# Patient Record
Sex: Male | Born: 1955 | Race: White | Hispanic: No | Smoking: Former smoker
Health system: Southern US, Community
[De-identification: ages and names within clinical notes are randomized; demographics above are authoritative.]

## PROBLEM LIST (undated history)

## (undated) DIAGNOSIS — M48061 Spinal stenosis, lumbar region without neurogenic claudication: Secondary | ICD-10-CM

## (undated) DIAGNOSIS — G629 Polyneuropathy, unspecified: Secondary | ICD-10-CM

## (undated) DIAGNOSIS — M5126 Other intervertebral disc displacement, lumbar region: Secondary | ICD-10-CM

## (undated) DIAGNOSIS — C61 Malignant neoplasm of prostate: Secondary | ICD-10-CM

## (undated) DIAGNOSIS — J189 Pneumonia, unspecified organism: Secondary | ICD-10-CM

## (undated) DIAGNOSIS — K219 Gastro-esophageal reflux disease without esophagitis: Secondary | ICD-10-CM

## (undated) DIAGNOSIS — M51369 Other intervertebral disc degeneration, lumbar region without mention of lumbar back pain or lower extremity pain: Secondary | ICD-10-CM

## (undated) DIAGNOSIS — J449 Chronic obstructive pulmonary disease, unspecified: Secondary | ICD-10-CM

## (undated) DIAGNOSIS — C801 Malignant (primary) neoplasm, unspecified: Secondary | ICD-10-CM

## (undated) DIAGNOSIS — M5136 Other intervertebral disc degeneration, lumbar region: Secondary | ICD-10-CM

## (undated) HISTORY — PX: PROSTATE BIOPSY: SHX241

## (undated) HISTORY — DX: Malignant neoplasm of prostate: C61

---

## 2004-03-22 ENCOUNTER — Ambulatory Visit (HOSPITAL_COMMUNITY): Admission: RE | Admit: 2004-03-22 | Discharge: 2004-03-22 | Payer: Self-pay | Admitting: Chiropractic Medicine

## 2004-09-24 ENCOUNTER — Emergency Department (HOSPITAL_COMMUNITY): Admission: EM | Admit: 2004-09-24 | Discharge: 2004-09-24 | Payer: Self-pay | Admitting: Emergency Medicine

## 2008-03-11 ENCOUNTER — Ambulatory Visit (HOSPITAL_COMMUNITY): Admission: RE | Admit: 2008-03-11 | Discharge: 2008-03-11 | Payer: Self-pay | Admitting: *Deleted

## 2014-01-09 ENCOUNTER — Other Ambulatory Visit (HOSPITAL_COMMUNITY): Payer: Self-pay | Admitting: Urology

## 2014-01-09 DIAGNOSIS — N400 Enlarged prostate without lower urinary tract symptoms: Secondary | ICD-10-CM | POA: Diagnosis not present

## 2014-01-14 ENCOUNTER — Ambulatory Visit (HOSPITAL_COMMUNITY)
Admission: RE | Admit: 2014-01-14 | Discharge: 2014-01-14 | Disposition: A | Discharge status: Home / Self Care | Payer: Medicare Other | Source: Ambulatory Visit | Attending: Urology | Admitting: Urology

## 2014-01-14 ENCOUNTER — Encounter (HOSPITAL_COMMUNITY): Payer: Self-pay

## 2014-01-14 DIAGNOSIS — N400 Enlarged prostate without lower urinary tract symptoms: Secondary | ICD-10-CM | POA: Diagnosis not present

## 2014-01-14 DIAGNOSIS — M549 Dorsalgia, unspecified: Secondary | ICD-10-CM | POA: Diagnosis not present

## 2014-01-14 DIAGNOSIS — R109 Unspecified abdominal pain: Secondary | ICD-10-CM | POA: Diagnosis not present

## 2014-01-14 LAB — POCT I-STAT CREATININE: CREATININE: 1.1 mg/dL (ref 0.50–1.35)

## 2014-01-14 MED ORDER — IOHEXOL 300 MG/ML  SOLN
125.0000 mL | Freq: Once | INTRAMUSCULAR | Status: AC | PRN
  Administered 2014-01-14: 125 mL via INTRAVENOUS

## 2014-01-27 DIAGNOSIS — N400 Enlarged prostate without lower urinary tract symptoms: Secondary | ICD-10-CM | POA: Diagnosis not present

## 2014-01-27 DIAGNOSIS — R972 Elevated prostate specific antigen [PSA]: Secondary | ICD-10-CM | POA: Diagnosis not present

## 2014-03-03 DIAGNOSIS — N400 Enlarged prostate without lower urinary tract symptoms: Secondary | ICD-10-CM | POA: Diagnosis not present

## 2014-03-31 DIAGNOSIS — R972 Elevated prostate specific antigen [PSA]: Secondary | ICD-10-CM | POA: Diagnosis not present

## 2014-04-29 DIAGNOSIS — R972 Elevated prostate specific antigen [PSA]: Secondary | ICD-10-CM | POA: Diagnosis not present

## 2014-05-06 DIAGNOSIS — R972 Elevated prostate specific antigen [PSA]: Secondary | ICD-10-CM | POA: Diagnosis not present

## 2014-05-06 DIAGNOSIS — N400 Enlarged prostate without lower urinary tract symptoms: Secondary | ICD-10-CM | POA: Diagnosis not present

## 2014-06-30 DIAGNOSIS — F17210 Nicotine dependence, cigarettes, uncomplicated: Secondary | ICD-10-CM | POA: Diagnosis not present

## 2014-06-30 DIAGNOSIS — R972 Elevated prostate specific antigen [PSA]: Secondary | ICD-10-CM | POA: Diagnosis not present

## 2014-06-30 DIAGNOSIS — R3919 Other difficulties with micturition: Secondary | ICD-10-CM | POA: Diagnosis not present

## 2014-06-30 DIAGNOSIS — N4289 Other specified disorders of prostate: Secondary | ICD-10-CM | POA: Diagnosis not present

## 2014-07-31 DIAGNOSIS — R3919 Other difficulties with micturition: Secondary | ICD-10-CM | POA: Diagnosis not present

## 2014-07-31 DIAGNOSIS — F17210 Nicotine dependence, cigarettes, uncomplicated: Secondary | ICD-10-CM | POA: Diagnosis not present

## 2014-07-31 DIAGNOSIS — C61 Malignant neoplasm of prostate: Secondary | ICD-10-CM | POA: Diagnosis not present

## 2014-07-31 DIAGNOSIS — N4289 Other specified disorders of prostate: Secondary | ICD-10-CM | POA: Diagnosis not present

## 2014-07-31 DIAGNOSIS — D075 Carcinoma in situ of prostate: Secondary | ICD-10-CM | POA: Diagnosis not present

## 2014-07-31 DIAGNOSIS — R972 Elevated prostate specific antigen [PSA]: Secondary | ICD-10-CM | POA: Diagnosis not present

## 2014-08-08 DIAGNOSIS — R3919 Other difficulties with micturition: Secondary | ICD-10-CM | POA: Diagnosis not present

## 2014-08-08 DIAGNOSIS — C61 Malignant neoplasm of prostate: Secondary | ICD-10-CM | POA: Diagnosis not present

## 2014-08-08 DIAGNOSIS — R972 Elevated prostate specific antigen [PSA]: Secondary | ICD-10-CM | POA: Diagnosis not present

## 2014-08-08 DIAGNOSIS — F17210 Nicotine dependence, cigarettes, uncomplicated: Secondary | ICD-10-CM | POA: Diagnosis not present

## 2014-08-29 DIAGNOSIS — F17210 Nicotine dependence, cigarettes, uncomplicated: Secondary | ICD-10-CM | POA: Diagnosis not present

## 2014-08-29 DIAGNOSIS — C61 Malignant neoplasm of prostate: Secondary | ICD-10-CM | POA: Diagnosis not present

## 2014-08-29 DIAGNOSIS — R972 Elevated prostate specific antigen [PSA]: Secondary | ICD-10-CM | POA: Diagnosis not present

## 2014-10-08 ENCOUNTER — Ambulatory Visit: Payer: Medicare Other | Admitting: Radiation Oncology

## 2014-12-02 DIAGNOSIS — C61 Malignant neoplasm of prostate: Secondary | ICD-10-CM | POA: Diagnosis not present

## 2014-12-03 ENCOUNTER — Other Ambulatory Visit (HOSPITAL_COMMUNITY): Payer: Self-pay | Admitting: Urology

## 2014-12-03 ENCOUNTER — Other Ambulatory Visit: Payer: Self-pay | Admitting: Urology

## 2014-12-03 DIAGNOSIS — C61 Malignant neoplasm of prostate: Secondary | ICD-10-CM

## 2014-12-10 DIAGNOSIS — M6281 Muscle weakness (generalized): Secondary | ICD-10-CM | POA: Diagnosis not present

## 2014-12-10 DIAGNOSIS — C61 Malignant neoplasm of prostate: Secondary | ICD-10-CM | POA: Diagnosis not present

## 2014-12-17 ENCOUNTER — Encounter (HOSPITAL_COMMUNITY)
Admission: RE | Admit: 2014-12-17 | Discharge: 2014-12-17 | Disposition: A | Discharge status: Home / Self Care | Payer: Medicare Other | Source: Ambulatory Visit | Attending: Urology | Admitting: Urology

## 2014-12-17 DIAGNOSIS — I723 Aneurysm of iliac artery: Secondary | ICD-10-CM | POA: Diagnosis not present

## 2014-12-17 DIAGNOSIS — C61 Malignant neoplasm of prostate: Secondary | ICD-10-CM | POA: Diagnosis not present

## 2014-12-17 MED ORDER — TECHNETIUM TC 99M MEDRONATE IV KIT
25.0000 | PACK | Freq: Once | INTRAVENOUS | Status: AC | PRN
  Administered 2014-12-17: 25 via INTRAVENOUS

## 2014-12-22 NOTE — Patient Instructions (Addendum)
Your procedure is scheduled on:  12/29/14  MONDAY  Report to [facility] at   0900    AM.   Call this number if you have problems the morning of surgery: [phone]     BOWEL PREP AS PER OFFICE   Do not TAKE ANYTHING BY MOUTH After Midnight. Sunday NIGHT   Take these medicines the morning of surgery with A SIP OF WATER:NONE   .  Contacts, dentures or partial plates, or metal hairpins  can not be worn to surgery. Your family will be responsible for glasses, dentures, hearing aides while you are in surgery  Leave suitcase in the car. After surgery it may be brought to your room.  For patients admitted to the hospital, checkout time is 11:00 AM day of  discharge.         [facility] IS NOT RESPONSIBLE FOR ANY VALUABLES  Patients discharged the day of surgery will not be allowed to drive home. IF going home the day of surgery, you must have a driver and someone to stay with you for the first 24 hours                                                                    Incentive Spirometer  An incentive spirometer is a tool that can help keep your lungs clear and active. This tool measures how well you are filling your lungs with each breath. Taking long deep breaths may help reverse or decrease the chance of developing breathing (pulmonary) problems (especially infection) following:  A long period of time when you are unable to move or be active. BEFORE THE PROCEDURE   If the spirometer includes an indicator to show your best effort, your nurse or respiratory therapist will set it to a desired goal.  If possible, sit up straight or lean slightly forward. Try not to slouch.  Hold the incentive spirometer in an upright position. INSTRUCTIONS FOR USE   Sit on the edge of your bed if possible, or sit up as far as you can in bed or on a chair.  Hold the incentive spirometer in an upright position.  Breathe  out normally.  Place the mouthpiece in your mouth and seal your lips tightly around it.  Breathe in slowly and as deeply as possible, raising the piston or the ball toward the top of the column.  Hold your breath for 3-5 seconds or for as long as possible. Allow the piston or ball to fall to the bottom of the column.  Remove the mouthpiece from your mouth and breathe out normally.  Rest for a few seconds and repeat Steps 1 through 7 at least 10 times every 1-2 hours when you are awake. Take your time and take a few normal breaths between deep breaths.  The spirometer may include an indicator to show your best effort. Use the indicator as a goal to work toward during each repetition.  After each set of 10 deep breaths, practice coughing to be sure your lungs are clear. If you have an incision (the cut made at the time of surgery), support your incision when coughing by placing a pillow  or rolled up towels firmly against it. Once you are able to get out of bed, walk around indoors and cough well. You may stop using the incentive spirometer when instructed by your caregiver.  RISKS AND COMPLICATIONS  Take your time so you do not get dizzy or light-headed.  If you are in pain, you may need to take or ask for pain medication before doing incentive spirometry. It is harder to take a deep breath if you are having pain. AFTER USE  Rest and breathe slowly and easily.  It can be helpful to keep track of a log of your progress. Your caregiver can provide you with a simple table to help with this. If you are using the spirometer at home, follow these instructions: [place] IF:   You are having difficultly using the spirometer.  You have trouble using the spirometer as often as instructed.  Your pain medication is not giving enough relief while using the spirometer.  You develop fever of 100.5 F (38.1 C) or higher. SEEK IMMEDIATE MEDICAL CARE IF:   You cough up bloody sputum that  had not been present before.  You develop fever of 102 F (38.9 C) or greater.  You develop worsening pain at or near the incision site. MAKE SURE YOU:   Understand these instructions.  Will watch your condition.  Will get help right away if you are not doing well or get worse. Document Released: 01/23/2007 Document Revised: 12/05/2011 Document Reviewed: 03/26/2007 ExitCare Patient Information 2014 ExitCare, [place].   ________________________________________________________________________  WHAT IS A BLOOD TRANSFUSION? Blood Transfusion Information  A transfusion is the replacement of blood or some of its parts. Blood is made up of multiple cells which provide different functions.  Red blood cells carry oxygen and are used for blood loss replacement.  White blood cells fight against infection.  Platelets control bleeding.  Plasma helps clot blood.  Other blood products are available for specialized needs, such as hemophilia or other clotting disorders. BEFORE THE TRANSFUSION  Who gives blood for transfusions?   Healthy volunteers who are fully evaluated to make sure their blood is safe. This is blood bank blood. Transfusion therapy is the safest it has ever been in the practice of medicine. Before blood is taken from a donor, a complete history is taken to make sure that person has no history of diseases nor engages in risky social behavior (examples are intravenous drug use or sexual activity with multiple partners). The donor's travel history is screened to minimize risk of transmitting infections, such as malaria. The donated blood is tested for signs of infectious diseases, such as HIV and hepatitis. The blood is then tested to be sure it is compatible with you in order to minimize the chance of a transfusion reaction. If you or a relative donates blood, this is often done in anticipation of surgery and is not appropriate for emergency situations. It takes many days to process  the donated blood. RISKS AND COMPLICATIONS Although transfusion therapy is very safe and saves many lives, the main dangers of transfusion include:   Getting an infectious disease.  Developing a transfusion reaction. This is an allergic reaction to something in the blood you were given. Every precaution is taken to prevent this. The decision to have a blood transfusion has been considered carefully by your caregiver before blood is given. Blood is not given unless the benefits outweigh the risks. AFTER THE TRANSFUSION  Right after receiving a blood transfusion, you will  usually feel much better and more energetic. This is especially true if your red blood cells have gotten low (anemic). The transfusion raises the level of the red blood cells which carry oxygen, and this usually causes an energy increase.  The nurse administering the transfusion will monitor you carefully for complications. HOME CARE INSTRUCTIONS  No special instructions are needed after a transfusion. You may find your energy is better. Speak with your caregiver about any limitations on activity for underlying diseases you may have. SEEK MEDICAL CARE IF:   Your condition is not improving after your transfusion.  You develop redness or irritation at the intravenous (IV) site. SEEK IMMEDIATE MEDICAL CARE IF:  Any of the following symptoms occur over the next 12 hours:  Shaking chills.  You have a temperature by mouth above 102 F (38.9 C), not controlled by medicine.  Chest, back, or muscle pain.  People around you feel you are not acting correctly or are confused.  Shortness of breath or difficulty breathing.  Dizziness and fainting.  You get a rash or develop hives.  You have a decrease in urine output.  Your urine turns a dark color or changes to pink, red, or brown. Any of the following symptoms occur over the next 10 days:  You have a temperature by mouth above 102 F (38.9 C), not controlled by  medicine.  Shortness of breath.  Weakness after normal activity.  The white part of the eye turns yellow (jaundice).  You have a decrease in the amount of urine or are urinating less often.  Your urine turns a dark color or changes to pink, red, or brown. Document Released: 09/09/2000 Document Revised: 12/05/2011 Document Reviewed: 04/28/2008 ExitCare Patient Information 2014 ExitCare, [place].  _______________________________________________________________________   CLEAR LIQUID DIET   Foods Allowed                                                                     Foods Excluded  Coffee and tea, regular and decaf                             liquids that you cannot  Plain Jell-O in any flavor                                             see through such as: Fruit ices (not with fruit pulp)                                     milk, soups, orange juice  Iced Popsicles                                    All solid food Carbonated beverages, regular and diet                                    Cranberry,  grape and apple juices Sports drinks like Gatorade Lightly seasoned clear broth or consume(fat free) Sugar, honey syrup  Sample Menu Breakfast                                Lunch                                     Supper Cranberry juice                    Beef broth                            Chicken broth Jell-O                                     Grape juice                           Apple juice Coffee or tea                        Jell-O                                      Popsicle                                                Coffee or tea                        Coffee or tea  _____________________________________________________________________  [facility] - Preparing for Surgery Before surgery, you can play an important role.  Because skin is not sterile, your skin needs to be as free of germs as possible.  You can reduce the number of germs on your skin by washing with  CHG (chlorahexidine gluconate) soap before surgery.  CHG is an antiseptic cleaner which kills germs and bonds with the skin to continue killing germs even after washing. Please DO NOT use if you have an allergy to CHG or antibacterial soaps.  If your skin becomes reddened/irritated stop using the CHG and inform your nurse when you arrive at Short Stay. Do not shave (including legs and underarms) for at least 48 hours prior to the first CHG shower.  You may shave your face/neck. Please follow these instructions carefully:  1.  Shower with CHG Soap the night before surgery and the  morning of Surgery.  2.  If you choose to wash your hair, wash your hair first as usual with your  normal  shampoo.  3.  After you shampoo, rinse your hair and body thoroughly to remove the  shampoo.                           4.  Use CHG as you would any other liquid soap.  You can apply chg directly  to the skin and wash  Gently with a scrungie or clean washcloth.  5.  Apply the CHG Soap to your body ONLY FROM THE NECK DOWN.   Do not use on face/ open                           Wound or open sores. Avoid contact with eyes, ears mouth and genitals (private parts).                       Wash face,  Genitals (private parts) with your normal soap.             6.  Wash thoroughly, paying special attention to the area where your surgery  will be performed.  7.  Thoroughly rinse your body with warm water from the neck down.  8.  DO NOT shower/wash with your normal soap after using and rinsing off  the CHG Soap.                9.  Pat yourself dry with a clean towel.            10.  Wear clean pajamas.            11.  Place clean sheets on your bed the night of your first shower and do not  sleep with pets. Day of Surgery : Do not apply any lotions/deodorants the morning of surgery.  Please wear clean clothes to the hospital/surgery center.  FAILURE TO FOLLOW THESE INSTRUCTIONS MAY RESULT IN THE CANCELLATION  OF YOUR SURGERY PATIENT SIGNATURE_________________________________  NURSE SIGNATURE__________________________________  ________________________________________________________________________

## 2014-12-23 ENCOUNTER — Encounter (HOSPITAL_COMMUNITY)
Admission: RE | Admit: 2014-12-23 | Discharge: 2014-12-23 | Disposition: A | Discharge status: Home / Self Care | Payer: Medicare Other | Source: Ambulatory Visit | Attending: Urology | Admitting: Urology

## 2014-12-23 ENCOUNTER — Other Ambulatory Visit (HOSPITAL_COMMUNITY): Payer: Self-pay | Admitting: Urology

## 2014-12-23 ENCOUNTER — Encounter (HOSPITAL_COMMUNITY): Payer: Self-pay

## 2014-12-23 ENCOUNTER — Ambulatory Visit (HOSPITAL_COMMUNITY)
Admission: RE | Admit: 2014-12-23 | Discharge: 2014-12-23 | Disposition: A | Discharge status: Home / Self Care | Payer: Medicare Other | Source: Ambulatory Visit | Attending: Urology | Admitting: Urology

## 2014-12-23 DIAGNOSIS — C61 Malignant neoplasm of prostate: Secondary | ICD-10-CM

## 2014-12-23 DIAGNOSIS — M542 Cervicalgia: Secondary | ICD-10-CM | POA: Insufficient documentation

## 2014-12-23 DIAGNOSIS — J449 Chronic obstructive pulmonary disease, unspecified: Secondary | ICD-10-CM | POA: Diagnosis not present

## 2014-12-23 DIAGNOSIS — M5032 Other cervical disc degeneration, mid-cervical region: Secondary | ICD-10-CM | POA: Diagnosis not present

## 2014-12-23 DIAGNOSIS — M6281 Muscle weakness (generalized): Secondary | ICD-10-CM | POA: Diagnosis not present

## 2014-12-23 HISTORY — DX: Pneumonia, unspecified organism: J18.9

## 2014-12-23 HISTORY — DX: Spinal stenosis, lumbar region without neurogenic claudication: M48.061

## 2014-12-23 HISTORY — DX: Other intervertebral disc displacement, lumbar region: M51.26

## 2014-12-23 HISTORY — DX: Gastro-esophageal reflux disease without esophagitis: K21.9

## 2014-12-23 HISTORY — DX: Polyneuropathy, unspecified: G62.9

## 2014-12-23 HISTORY — DX: Malignant (primary) neoplasm, unspecified: C80.1

## 2014-12-23 HISTORY — DX: Other intervertebral disc degeneration, lumbar region: M51.36

## 2014-12-23 HISTORY — DX: Chronic obstructive pulmonary disease, unspecified: J44.9

## 2014-12-23 HISTORY — DX: Other intervertebral disc degeneration, lumbar region without mention of lumbar back pain or lower extremity pain: M51.369

## 2014-12-23 LAB — CBC
HEMATOCRIT: 45.1 % (ref 39.0–52.0)
HEMOGLOBIN: 14.5 g/dL (ref 13.0–17.0)
MCH: 27.6 pg (ref 26.0–34.0)
MCHC: 32.2 g/dL (ref 30.0–36.0)
MCV: 85.7 fL (ref 78.0–100.0)
Platelets: 210 10*3/uL (ref 150–400)
RBC: 5.26 MIL/uL (ref 4.22–5.81)
RDW: 14.7 % (ref 11.5–15.5)
WBC: 11.8 10*3/uL — AB (ref 4.0–10.5)

## 2014-12-23 LAB — BASIC METABOLIC PANEL
Anion gap: 7 (ref 5–15)
BUN: 14 mg/dL (ref 6–23)
CALCIUM: 9.3 mg/dL (ref 8.4–10.5)
CO2: 24 mmol/L (ref 19–32)
Chloride: 104 mmol/L (ref 96–112)
Creatinine, Ser: 1.01 mg/dL (ref 0.50–1.35)
GFR calc Af Amer: >90 mL/min (ref >90)
GFR calc non Af Amer: 79 mL/min — ABNORMAL LOW (ref >90)
Glucose, Bld: 104 mg/dL — ABNORMAL HIGH (ref 70–99)
Potassium: 4.4 mmol/L (ref 3.5–5.1)
Sodium: 135 mmol/L (ref 135–145)

## 2014-12-23 NOTE — Progress Notes (Signed)
Faxed cbc to dr borden via epic

## 2014-12-26 NOTE — H&P (Signed)
Chief Complaint Prostate Cancer   Reason For Visit Reason for consult: To discuss treatment options for prostate cancer.  Physician requesting consult: Dr. Clyde Lundborg  PCP: None   History of Present Illness Carl Bowen is a 59 year old gentleman who was noted to have an elevated PSA of 12.5 and was found to be 13.76 when repeated prompting a prostate biopsy on 07/31/14. 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 He has been counseled by Dr. Exie Parody thoroughly about his treatment options. He has no family history of prostate cancer. He does have significant back pain and is disabled due to his back pain. He has noted some increased pain in his lower back and radiating to his groins more recently. He typically takes Tylenol for relief.    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    TNM stage: cT2c Nx Mx  PSA: 13.76  Gleason score: 4+3=7  Biopsy (07/31/14 - read by Dr. [place], Acc # [id number]): 15/15 cores positive - -57% of total tissue   Left: L apex (2/2 cores, 40 and 60%, 4+3=7), L mid (2/2 cores, 40 and 90%, 4+3=7), L base (3/3 cores, 50,70 and 80%, 4+3=7)   Right: R apex (2/2 cores, 50 and 50%, 3+4=7), R mid (3/3 cores, 25, 75, and 76%, 3+4=7), R base (3/3 cores, 25, 50, and 50%, 3+4=7)  Prostate volume: 27 cc    Urinary function: He does have moderate voiding symptoms. IPSS is 20. He does take tamsulosin 0.4 mg.  Erectile function: SHIM score is 1. He is sexually inactive and has been for multiple years. This is not a priority to him.   Past Medical History Problems  1. History of back problems 2. History of spinal stenosis (Z87.39)  Surgical History Problems  1. History of No Surgical Problems  Current Meds 1. Tamsulosin HCl - 0.4 MG Oral Capsule;  Therapy: (Recorded:08Mar2016) to Recorded 2. Tylenol  CAPS;  Therapy: (Recorded:08Mar2016) to Recorded  Allergies Medication  1. No Known Drug Allergies  Family History Problems  1. Family history of cardiac disorder (Z82.49) : Daughter 2. Family history of diabetes mellitus (Z83.3) : Mother, Father 3. Family history of Heart transplanted : Daughter 4. Family history of Urinary calculus : Brother  Social History Problems    Denied: History of Alcohol use   Current every day smoker (F17.200)   Divorced  Review of Systems Genitourinary, constitutional, skin, eye, otolaryngeal, hematologic/lymphatic, cardiovascular, pulmonary, endocrine, musculoskeletal, gastrointestinal, neurological and psychiatric system(s) were reviewed and pertinent findings if present are noted and are otherwise negative.  Musculoskeletal: back pain and joint pain.    Vitals Vital Signs [Data Includes: Last 1 Day]  Recorded: [id number] 11:09AM  Height: 5 ft 11 in Weight: 193 lb  BMI Calculated: 26.92 BSA Calculated: 2.08 Blood Pressure: 122 / 73 Heart Rate: 76  Physical Exam Constitutional: Well nourished and well developed . No acute distress.  ENT:. The ears and nose are normal in appearance.  Neck: The appearance of the neck is normal and no neck mass is present . There is no jugular-venous distention.  Pulmonary: No respiratory distress and normal respiratory rhythm and effort.  Cardiovascular: Heart rate and rhythm are normal . No peripheral edema.  Abdomen: The abdomen is soft and nontender. No masses are palpated. No CVA tenderness. No hernias are palpable. No hepatosplenomegaly noted.  Rectal: Prostate size is estimated to be 45 g. His prostate is diffusely firm with multiple nodular areas bilaterally. There is no definite extraprostatic disease.  Lymphatics: The femoral and inguinal nodes are not enlarged or tender.  Skin: Normal skin turgor, no visible rash and no visible skin lesions.  Neuro/Psych:. Mood and affect are appropriate.     Results/Data Urine [Data Includes: Last 1 Day]   [id number]  COLOR YELLOW   APPEARANCE CLEAR   SPECIFIC GRAVITY 1.020   pH 7.0   GLUCOSE NEG mg/dL  BILIRUBIN NEG   KETONE NEG mg/dL  BLOOD NEG   PROTEIN NEG mg/dL  UROBILINOGEN 0.2 mg/dL  NITRITE NEG   LEUKOCYTE ESTERASE NEG    I have reviewed his medical records, PSA results, and pathology report. Findings are as dictated above.   Assessment Assessed  1. Prostate cancer (C61)  Plan Health Maintenance  1. UA With REFLEX; [Do Not Release]; Status:Complete;   Done: [id number] 10:47AM Prostate cancer  2. BONE SCAN; Status:Hold For - Appointment,PreCert,Print,Records; Requested  for:08Mar2016;  3. CREATININE with eGFR; Status:In Progress - Specimen/Data Collected;   Done:  [id number] 4. CT-PELVIS WITH CONTRAST; Status:Hold For - Appointment,PreCert,Date of  Service,Print; Requested for:08Mar2016;  5. VENIPUNCTURE; Status:Complete;   Done: [id number] 6. Follow-up Office  Follow-up - will call to schedule surgery.  Status: Complete  Done:  [id number]  Discussion/Summary 1. Prostate cancer: We discussed his prostate cancer diagnosis in detail. I did recommend that he undergo a complete staging evaluation including bone scan and CT scan imaging for further evaluation considering his bilaterally palpable disease and high-volume disease noted on his biopsy as well as his back pain symptoms. Assuming that this is negative, we discussed options for therapy for curative intent including the primary surgical therapy and primary radiation therapy. He understands that surgical therapy would carry a high risk of potentially necessitating adjuvant or salvage treatment in the future considering his disease parameters. He also understands that optimal therapy with radiation would involve androgen deprivation treatment. We have reviewed the pros and cons of these options extensively today.   The patient was counseled about the natural history of  prostate cancer and the standard treatment options that are available for prostate cancer. It was explained to him how his age and life expectancy, clinical stage, Gleason score, and PSA affect his prognosis, the decision to proceed with additional staging studies, as well as how that information influences recommended treatment strategies. We discussed the roles for active surveillance, radiation therapy, surgical therapy, androgen deprivation, as well as ablative therapy options for the treatment of prostate cancer as appropriate to his individual cancer situation. We discussed the risks and benefits of these options with regard to their impact on cancer control and also in terms of potential adverse events, complications, and impact on quiality of life particularly related to urinary, bowel, and sexual function. The patient was encouraged to ask questions throughout the discussion today and all questions were answered to his stated satisfaction. In addition, the patient was provided with and/or directed to appropriate resources and literature for further education about prostate cancer and treatment options.   We discussed surgical therapy for prostate cancer including the different available surgical approaches. We discussed, in detail, the risks and expectations of surgery with regard to cancer control, urinary control, and erectile function as well as the expected postoperative recovery process. The risks, potential complications/adverse events of radical prostatectomy as well as alternative options were explained to the patient.     After  further discussion, he is most interested in primary surgical therapy assuming that his staging studies are negative for metastatic disease. Considering his erectile dysfunction and his high-risk disease, I recommended that we consider a non-nerve sparing robotic-assisted laparoscopic radical prostatectomy and bilateral pelvic lymphadenectomy. This will tentatively be  scheduled pending his staging studies.    Cc: Dr. Clyde Lundborg    Verified Results CT-PELVIS WITH CONTRAST1 [id number] 12:00AM1 Read Drivers  [Dec 17, 2014 9:13PM Alinda Money, Yulia Ulrich] Please let Mr. Snipe know that overall his bone scan and CT scan look ok. The only finding that raises any concern is the cervical spine. The changes on his bone scan are likely related to degenerative (arthritic) changes but I would recommend he get plain x-rays of his cervical spine for further evaluation to be sure. I have placed order. Please notify patient. Please order ASAP (or communicate to [facility]) as patient is scheduled for surgery soon. Thanks.   Test Name Result Flag Reference  CT-PELVIS WITH CONTRAST1 (Report)1    ** RADIOLOGY REPORT BY [facility] **   CLINICAL DATA: 59 year old male with high risk clinically localized prostate cancer presenting with back pain. Evaluate for potential metastatic disease.  EXAM: CT PELVIS WITH CONTRAST  TECHNIQUE: Multidetector CT imaging of the pelvis was performed using the standard protocol following the bolus administration of intravenous contrast.  CONTRAST: 100 mL of Isovue-300.  COMPARISON: CT of the abdomen and pelvis 01/14/2014.  FINDINGS: Urinary Tract: Visualized portions of the lower kidneys, ureters and urinary bladder are normal in appearance.  Bowel: No pathologic dilatation of the visualized portions of the small bowel or colon. Normal appendix.  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 No definite pathologically enlarged lymph nodes are noted in the pelvis or visualized portions of the lower retroperitoneum. There are several prominent but nonenlarged right-sided pelvic lymph nodes, measuring up to 8 mm in the right external iliac nodal station, however, these are all  unchanged in size, number and distribution compared to prior CT scan 01/14/2014, presumably benign.  Reproductive: Prostate gland is heterogeneous in appearance with multiple calcifications, but is not enlarged. No discrete prostate mass identified by CT imaging. No evidence of periprostatic extension of soft tissue to suggest locally invasive disease. Seminal vesicles are unremarkable in appearance.  Other: No significant volume of ascites. No pneumoperitoneum.  Musculoskeletal: There are no aggressive appearing lytic or blastic lesions noted in the visualized portions of the skeleton.  IMPRESSION: 1. Prostate gland is grossly unremarkable in appearance on today's examination, with no definite signs of locally invasive disease or definite evidence of metastatic disease in the pelvis. 2. 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 3. Normal appendix.   Electronically Signed  By: Vinnie Langton M.D.  On: 12/17/2014 10:17     1. Amended By: [name]; Dec 17 2014 9:13 PM EST  SignaturesElectronically signed by : [name], M.D.; Dec 17 2014  9:13PM EST

## 2014-12-29 ENCOUNTER — Encounter (HOSPITAL_COMMUNITY)
Admission: RE | Disposition: A | Discharge status: Home / Self Care | Payer: Self-pay | Source: Ambulatory Visit | Attending: Urology

## 2014-12-29 ENCOUNTER — Inpatient Hospital Stay (HOSPITAL_COMMUNITY): Payer: Medicare Other | Admitting: Anesthesiology

## 2014-12-29 ENCOUNTER — Encounter (HOSPITAL_COMMUNITY): Payer: Self-pay | Admitting: *Deleted

## 2014-12-29 ENCOUNTER — Inpatient Hospital Stay (HOSPITAL_COMMUNITY)
Admission: RE | Admit: 2014-12-29 | Discharge: 2014-12-30 | DRG: 708 | Disposition: A | Discharge status: Home / Self Care | Payer: Medicare Other | Source: Ambulatory Visit | Attending: Urology | Admitting: Urology

## 2014-12-29 DIAGNOSIS — Z833 Family history of diabetes mellitus: Secondary | ICD-10-CM | POA: Diagnosis not present

## 2014-12-29 DIAGNOSIS — C775 Secondary and unspecified malignant neoplasm of intrapelvic lymph nodes: Secondary | ICD-10-CM | POA: Diagnosis not present

## 2014-12-29 DIAGNOSIS — M4806 Spinal stenosis, lumbar region: Secondary | ICD-10-CM | POA: Diagnosis not present

## 2014-12-29 DIAGNOSIS — F17210 Nicotine dependence, cigarettes, uncomplicated: Secondary | ICD-10-CM | POA: Diagnosis present

## 2014-12-29 DIAGNOSIS — K219 Gastro-esophageal reflux disease without esophagitis: Secondary | ICD-10-CM | POA: Diagnosis not present

## 2014-12-29 DIAGNOSIS — M549 Dorsalgia, unspecified: Secondary | ICD-10-CM | POA: Diagnosis not present

## 2014-12-29 DIAGNOSIS — J449 Chronic obstructive pulmonary disease, unspecified: Secondary | ICD-10-CM | POA: Diagnosis present

## 2014-12-29 DIAGNOSIS — C61 Malignant neoplasm of prostate: Principal | ICD-10-CM | POA: Diagnosis present

## 2014-12-29 HISTORY — PX: ROBOT ASSISTED LAPAROSCOPIC RADICAL PROSTATECTOMY: SHX5141

## 2014-12-29 HISTORY — PX: LYMPHADENECTOMY: SHX5960

## 2014-12-29 LAB — ABO/RH: ABO/RH(D): A POS

## 2014-12-29 LAB — TYPE AND SCREEN
ABO/RH(D): A POS
Antibody Screen: NEGATIVE

## 2014-12-29 LAB — HEMOGLOBIN AND HEMATOCRIT, BLOOD
HCT: 42.9 % (ref 39.0–52.0)
HEMOGLOBIN: 13.8 g/dL (ref 13.0–17.0)

## 2014-12-29 SURGERY — ROBOTIC ASSISTED LAPAROSCOPIC RADICAL PROSTATECTOMY LEVEL 2
Anesthesia: General

## 2014-12-29 MED ORDER — CISATRACURIUM BESYLATE 20 MG/10ML IV SOLN
INTRAVENOUS | Status: AC
  Filled 2014-12-29: qty 10

## 2014-12-29 MED ORDER — HYDROMORPHONE HCL 1 MG/ML IJ SOLN
0.2500 mg | INTRAMUSCULAR | Status: DC | PRN
  Administered 2014-12-29 (×2): 0.5 mg via INTRAVENOUS

## 2014-12-29 MED ORDER — FENTANYL CITRATE 0.05 MG/ML IJ SOLN
INTRAMUSCULAR | Status: AC
  Filled 2014-12-29: qty 5

## 2014-12-29 MED ORDER — FENTANYL CITRATE 0.05 MG/ML IJ SOLN
INTRAMUSCULAR | Status: DC | PRN
  Administered 2014-12-29 (×7): 50 ug via INTRAVENOUS
  Administered 2014-12-29: 100 ug via INTRAVENOUS
  Administered 2014-12-29: 50 ug via INTRAVENOUS

## 2014-12-29 MED ORDER — PROPOFOL 10 MG/ML IV BOLUS
INTRAVENOUS | Status: AC
  Filled 2014-12-29: qty 20

## 2014-12-29 MED ORDER — NEOSTIGMINE METHYLSULFATE 10 MG/10ML IV SOLN
INTRAVENOUS | Status: AC
  Filled 2014-12-29: qty 1

## 2014-12-29 MED ORDER — NEOSTIGMINE METHYLSULFATE 10 MG/10ML IV SOLN
INTRAVENOUS | Status: DC | PRN
  Administered 2014-12-29: 3 mg via INTRAVENOUS

## 2014-12-29 MED ORDER — CEFAZOLIN SODIUM-DEXTROSE 2-3 GM-% IV SOLR
2.0000 g | INTRAVENOUS | Status: AC
  Administered 2014-12-29: 2 g via INTRAVENOUS

## 2014-12-29 MED ORDER — ONDANSETRON HCL 4 MG/2ML IJ SOLN
INTRAMUSCULAR | Status: AC
  Filled 2014-12-29: qty 2

## 2014-12-29 MED ORDER — KETOROLAC TROMETHAMINE 15 MG/ML IJ SOLN
15.0000 mg | Freq: Four times a day (QID) | INTRAMUSCULAR | Status: DC
  Administered 2014-12-29 – 2014-12-30 (×3): 15 mg via INTRAVENOUS
  Filled 2014-12-29 (×5): qty 1

## 2014-12-29 MED ORDER — DEXAMETHASONE SODIUM PHOSPHATE 10 MG/ML IJ SOLN
INTRAMUSCULAR | Status: AC
  Filled 2014-12-29: qty 1

## 2014-12-29 MED ORDER — BUPIVACAINE-EPINEPHRINE 0.25% -1:200000 IJ SOLN
INTRAMUSCULAR | Status: DC | PRN
  Administered 2014-12-29: 30 mL

## 2014-12-29 MED ORDER — CEFAZOLIN SODIUM-DEXTROSE 2-3 GM-% IV SOLR
INTRAVENOUS | Status: AC
  Filled 2014-12-29: qty 50

## 2014-12-29 MED ORDER — LACTATED RINGERS IV SOLN
INTRAVENOUS | Status: DC | PRN
  Administered 2014-12-29: 13:00:00

## 2014-12-29 MED ORDER — DIPHENHYDRAMINE HCL 50 MG/ML IJ SOLN: 12.5000 mg | Freq: Four times a day (QID) | INTRAMUSCULAR | Status: DC | PRN

## 2014-12-29 MED ORDER — LACTATED RINGERS IV SOLN
INTRAVENOUS | Status: DC
  Administered 2014-12-29 (×2): via INTRAVENOUS
  Administered 2014-12-29: 1000 mL via INTRAVENOUS

## 2014-12-29 MED ORDER — MORPHINE SULFATE 10 MG/ML IJ SOLN
2.0000 mg | INTRAMUSCULAR | Status: DC | PRN
  Administered 2014-12-29: 2 mg via INTRAVENOUS
  Filled 2014-12-29: qty 1

## 2014-12-29 MED ORDER — KCL IN DEXTROSE-NACL 20-5-0.45 MEQ/L-%-% IV SOLN
INTRAVENOUS | Status: DC
  Administered 2014-12-29 – 2014-12-30 (×2): via INTRAVENOUS
  Filled 2014-12-29 (×5): qty 1000

## 2014-12-29 MED ORDER — CIPROFLOXACIN HCL 500 MG PO TABS: 500.0000 mg | ORAL_TABLET | Freq: Two times a day (BID) | ORAL | Status: DC

## 2014-12-29 MED ORDER — GLYCOPYRROLATE 0.2 MG/ML IJ SOLN
INTRAMUSCULAR | Status: AC
  Filled 2014-12-29: qty 3

## 2014-12-29 MED ORDER — EPHEDRINE SULFATE 50 MG/ML IJ SOLN
INTRAMUSCULAR | Status: DC | PRN
  Administered 2014-12-29 (×2): 5 mg via INTRAVENOUS

## 2014-12-29 MED ORDER — DOCUSATE SODIUM 100 MG PO CAPS
100.0000 mg | ORAL_CAPSULE | Freq: Two times a day (BID) | ORAL | Status: DC
  Administered 2014-12-29 – 2014-12-30 (×2): 100 mg via ORAL
  Filled 2014-12-29 (×2): qty 1

## 2014-12-29 MED ORDER — STERILE WATER FOR IRRIGATION IR SOLN
Status: DC | PRN
  Administered 2014-12-29: 3000 mL

## 2014-12-29 MED ORDER — DIPHENHYDRAMINE HCL 12.5 MG/5ML PO ELIX: 12.5000 mg | ORAL_SOLUTION | Freq: Four times a day (QID) | ORAL | Status: DC | PRN

## 2014-12-29 MED ORDER — MIDAZOLAM HCL 2 MG/2ML IJ SOLN
INTRAMUSCULAR | Status: AC
  Filled 2014-12-29: qty 2

## 2014-12-29 MED ORDER — MIDAZOLAM HCL 5 MG/5ML IJ SOLN
INTRAMUSCULAR | Status: DC | PRN
Start: 2014-12-29 — End: 2014-12-29
  Administered 2014-12-29 (×2): 1 mg via INTRAVENOUS

## 2014-12-29 MED ORDER — PROPOFOL 10 MG/ML IV BOLUS
INTRAVENOUS | Status: DC | PRN
  Administered 2014-12-29: 150 mg via INTRAVENOUS

## 2014-12-29 MED ORDER — SODIUM CHLORIDE 0.9 % IR SOLN
Status: DC | PRN
Start: 2014-12-29 — End: 2014-12-29
  Administered 2014-12-29: 1000 mL via INTRAVESICAL

## 2014-12-29 MED ORDER — CISATRACURIUM BESYLATE (PF) 10 MG/5ML IV SOLN
INTRAVENOUS | Status: DC | PRN
  Administered 2014-12-29: 4 mg via INTRAVENOUS
  Administered 2014-12-29: 10 mg via INTRAVENOUS
  Administered 2014-12-29: 2 mg via INTRAVENOUS

## 2014-12-29 MED ORDER — SODIUM CHLORIDE 0.9 % IV BOLUS (SEPSIS)
1000.0000 mL | Freq: Once | INTRAVENOUS | Status: DC
Start: 2014-12-29 — End: 2014-12-29

## 2014-12-29 MED ORDER — ONDANSETRON HCL 4 MG/2ML IJ SOLN
INTRAMUSCULAR | Status: DC | PRN
  Administered 2014-12-29: 4 mg via INTRAVENOUS

## 2014-12-29 MED ORDER — HEPARIN SODIUM (PORCINE) 1000 UNIT/ML IJ SOLN
INTRAMUSCULAR | Status: AC
Start: 2014-12-29 — End: 2014-12-29
  Filled 2014-12-29: qty 1

## 2014-12-29 MED ORDER — LACTATED RINGERS IV SOLN: INTRAVENOUS | Status: DC

## 2014-12-29 MED ORDER — HYDROCODONE-ACETAMINOPHEN 5-325 MG PO TABS: 1.0000 | ORAL_TABLET | Freq: Four times a day (QID) | ORAL | Status: DC | PRN

## 2014-12-29 MED ORDER — HYDROMORPHONE HCL 1 MG/ML IJ SOLN
INTRAMUSCULAR | Status: AC
  Filled 2014-12-29: qty 1

## 2014-12-29 MED ORDER — DEXAMETHASONE SODIUM PHOSPHATE 10 MG/ML IJ SOLN
INTRAMUSCULAR | Status: DC | PRN
  Administered 2014-12-29: 10 mg via INTRAVENOUS

## 2014-12-29 MED ORDER — LIDOCAINE HCL (CARDIAC) 20 MG/ML IV SOLN
INTRAVENOUS | Status: DC | PRN
  Administered 2014-12-29: 80 mg via INTRAVENOUS

## 2014-12-29 MED ORDER — ACETAMINOPHEN 325 MG PO TABS
650.0000 mg | ORAL_TABLET | ORAL | Status: DC | PRN
  Administered 2014-12-29 – 2014-12-30 (×2): 650 mg via ORAL
  Filled 2014-12-29 (×2): qty 2

## 2014-12-29 MED ORDER — CEFAZOLIN SODIUM 1-5 GM-% IV SOLN
1.0000 g | Freq: Three times a day (TID) | INTRAVENOUS | Status: AC
  Administered 2014-12-29 – 2014-12-30 (×2): 1 g via INTRAVENOUS
  Filled 2014-12-29 (×2): qty 50

## 2014-12-29 MED ORDER — GLYCOPYRROLATE 0.2 MG/ML IJ SOLN
INTRAMUSCULAR | Status: DC | PRN
  Administered 2014-12-29: .6 mg via INTRAVENOUS
  Administered 2014-12-29: .1 mg via INTRAVENOUS

## 2014-12-29 MED ORDER — BUPIVACAINE-EPINEPHRINE (PF) 0.25% -1:200000 IJ SOLN
INTRAMUSCULAR | Status: AC
Start: 2014-12-29 — End: 2014-12-29
  Filled 2014-12-29: qty 30

## 2014-12-29 SURGICAL SUPPLY — 50 items
CABLE HIGH FREQUENCY MONO STRZ (ELECTRODE) ×4 IMPLANT
CATH FOLEY 2WAY SLVR 18FR 30CC (CATHETERS) ×4 IMPLANT
CATH ROBINSON RED A/P 16FR (CATHETERS) ×4 IMPLANT
CATH ROBINSON RED A/P 8FR (CATHETERS) ×4 IMPLANT
CATH TIEMANN FOLEY 18FR 5CC (CATHETERS) ×4 IMPLANT
CHLORAPREP W/TINT 26ML (MISCELLANEOUS) ×4 IMPLANT
CLIP LIGATING HEM O LOK PURPLE (MISCELLANEOUS) ×8 IMPLANT
CLOTH BEACON ORANGE TIMEOUT ST (SAFETY) ×4 IMPLANT
COVER SURGICAL LIGHT HANDLE (MISCELLANEOUS) ×4 IMPLANT
COVER TIP SHEARS 8 DVNC (MISCELLANEOUS) ×2 IMPLANT
COVER TIP SHEARS 8MM DA VINCI (MISCELLANEOUS) ×2
CUTTER ECHEON FLEX ENDO 45 340 (ENDOMECHANICALS) ×4 IMPLANT
DECANTER SPIKE VIAL GLASS SM (MISCELLANEOUS) ×2 IMPLANT
DRAPE SURG IRRIG POUCH 19X23 (DRAPES) ×4 IMPLANT
DRSG TEGADERM 4X4.75 (GAUZE/BANDAGES/DRESSINGS) ×4 IMPLANT
DRSG TEGADERM 6X8 (GAUZE/BANDAGES/DRESSINGS) ×8 IMPLANT
ELECT REM PT RETURN 9FT ADLT (ELECTROSURGICAL) ×4
ELECTRODE REM PT RTRN 9FT ADLT (ELECTROSURGICAL) ×2 IMPLANT
GLOVE BIO SURGEON STRL SZ 6.5 (GLOVE) ×3 IMPLANT
GLOVE BIO SURGEONS STRL SZ 6.5 (GLOVE) ×1
GLOVE BIOGEL M STRL SZ7.5 (GLOVE) ×8 IMPLANT
GOWN STRL REUS W/TWL LRG LVL3 (GOWN DISPOSABLE) ×12 IMPLANT
HOLDER FOLEY CATH W/STRAP (MISCELLANEOUS) ×4 IMPLANT
IV LACTATED RINGERS 1000ML (IV SOLUTION) ×2 IMPLANT
KIT ACCESSORY DA VINCI DISP (KITS) ×2
KIT ACCESSORY DVNC DISP (KITS) ×2 IMPLANT
LIQUID BAND (GAUZE/BANDAGES/DRESSINGS) ×2 IMPLANT
MANIFOLD NEPTUNE II (INSTRUMENTS) ×4 IMPLANT
NDL SAFETY ECLIPSE 18X1.5 (NEEDLE) ×2 IMPLANT
NEEDLE HYPO 18GX1.5 SHARP (NEEDLE) ×4
PACK ROBOT UROLOGY CUSTOM (CUSTOM PROCEDURE TRAY) ×4 IMPLANT
RELOAD GREEN ECHELON 45 (STAPLE) ×4 IMPLANT
SET TUBE IRRIG SUCTION NO TIP (IRRIGATION / IRRIGATOR) ×4 IMPLANT
SHEET LAVH (DRAPES) IMPLANT
SOLUTION ELECTROLUBE (MISCELLANEOUS) ×4 IMPLANT
SUT ETHILON 3 0 PS 1 (SUTURE) ×4 IMPLANT
SUT MNCRL 3 0 RB1 (SUTURE) ×2 IMPLANT
SUT MNCRL 3 0 VIOLET RB1 (SUTURE) ×2 IMPLANT
SUT MNCRL AB 4-0 PS2 18 (SUTURE) ×8 IMPLANT
SUT MONOCRYL 3 0 RB1 (SUTURE) ×4
SUT VIC AB 0 CT1 27 (SUTURE) ×4
SUT VIC AB 0 CT1 27XBRD ANTBC (SUTURE) ×2 IMPLANT
SUT VIC AB 0 UR5 27 (SUTURE) ×4 IMPLANT
SUT VIC AB 2-0 SH 27 (SUTURE) ×4
SUT VIC AB 2-0 SH 27X BRD (SUTURE) ×2 IMPLANT
SUT VICRYL 0 UR6 27IN ABS (SUTURE) ×8 IMPLANT
SYR 27GX1/2 1ML LL SAFETY (SYRINGE) ×4 IMPLANT
TOWEL OR 17X26 10 PK STRL BLUE (TOWEL DISPOSABLE) ×4 IMPLANT
TOWEL OR NON WOVEN STRL DISP B (DISPOSABLE) ×4 IMPLANT
WATER STERILE IRR 1500ML POUR (IV SOLUTION) ×4 IMPLANT

## 2014-12-29 NOTE — Anesthesia Postprocedure Evaluation (Signed)
  Anesthesia Post-op Note  Patient: Carl Bowen  Procedure(s) Performed: Procedure(s) (LRB): ROBOTIC ASSISTED LAPAROSCOPIC RADICAL PROSTATECTOMY LEVEL 2 (N/A) PELVIC LYMPHADENECTOMY (Bilateral)  Patient Location: PACU  Anesthesia Type: General  Level of Consciousness: awake and alert   Airway and Oxygen Therapy: Patient Spontanous Breathing  Post-op Pain: mild  Post-op Assessment: Post-op Vital signs reviewed, Patient's Cardiovascular Status Stable, Respiratory Function Stable, Patent Airway and No signs of Nausea or vomiting  Last Vitals:  Filed Vitals:   12/29/14 1525  BP: 130/90  Pulse: 64  Temp: 36.4 C  Resp: 14    Post-op Vital Signs: stable   Complications: No apparent anesthesia complications

## 2014-12-29 NOTE — Op Note (Signed)
Preoperative diagnosis: Clinically localized adenocarcinoma of the prostate (clinical stage T2c N0 M0)  Postoperative diagnosis: Clinically localized adenocarcinoma of the prostate (clinical stage T2c N0 M0)  Procedure:  1. Robotic assisted laparoscopic radical prostatectomy (non nerve sparing) 2. Bilateral robotic assisted laparoscopic pelvic lymphadenectomy  Surgeon: Pryor Curia. M.D.  Assistant(s): Debbrah Alar, PA-C  Resident: Dr. Amaryllis Dyke  Anesthesia: General  Complications: None  EBL: 50 mL  IVF:  1400 mL crystalloid  Specimens: 1. Prostate and seminal vesicles 2. Right pelvic lymph nodes 3. Left pelvic lymph nodes  Disposition of specimens: Pathology  Drains: 1. 20 Fr coude catheter 2. # 19 Blake pelvic drain  Indication: Carl Bowen is a 59 y.o. year old patient with clinically localized prostate cancer.  After a thorough review of the management options for treatment of prostate cancer, he elected to proceed with surgical therapy and the above procedure(s).  We have discussed the potential benefits and risks of the procedure, side effects of the proposed treatment, the likelihood of the patient achieving the goals of the procedure, and any potential problems that might occur during the procedure or recuperation. Informed consent has been obtained.  Description of procedure:  The patient was taken to the operating room and a general anesthetic was administered. He was given preoperative antibiotics, placed in the dorsal lithotomy position, and prepped and draped in the usual sterile fashion. Next a preoperative timeout was performed. A urethral catheter was placed into the bladder and a site was selected near the umbilicus for placement of the camera port. This was placed using a standard open Hassan technique which allowed entry into the peritoneal cavity under direct vision and without difficulty. A 12 mm port was placed and a pneumoperitoneum  established. The camera was then used to inspect the abdomen and there was no evidence of any intra-abdominal injuries or other abnormalities. The remaining abdominal ports were then placed. 8 mm robotic ports were placed in the right lower quadrant, left lower quadrant, and far left lateral abdominal wall. A 5 mm port was placed in the right upper quadrant and a 12 mm port was placed in the right lateral abdominal wall for laparoscopic assistance. All ports were placed under direct vision without difficulty. The surgical cart was then docked.   Utilizing the cautery scissors, the bladder was reflected posteriorly allowing entry into the space of Retzius and identification of the endopelvic fascia and prostate. The periprostatic fat was then removed from the prostate allowing full exposure of the endopelvic fascia. The endopelvic fascia was then incised from the apex back to the base of the prostate bilaterally and the underlying levator muscle fibers were swept laterally off the prostate thereby isolating the dorsal venous complex. The dorsal vein was then stapled and divided with a 45 mm Flex Echelon stapler. Attention then turned to the bladder neck which was divided anteriorly thereby allowing entry into the bladder and exposure of the urethral catheter. The catheter balloon was deflated and the catheter was brought into the operative field and used to retract the prostate anteriorly. The posterior bladder neck was then examined and was divided allowing further dissection between the bladder and prostate posteriorly until the vasa deferentia and seminal vessels were identified. The vasa deferentia were isolated, divided, and lifted anteriorly. The seminal vesicles were dissected down to their tips with care to control the seminal vascular arterial blood supply. These structures were then lifted anteriorly and the space between Denonvillier's fascia and the anterior rectum  was developed with a combination of  sharp and blunt dissection. This isolated the vascular pedicles of the prostate.  A wide non nerve sparing dissection was performed with Weck clips used to ligate the vascular pedicles of the prostate bilaterally. The vascular pedicles of the prostate were then divided.  The urethra was then sharply transected allowing the prostate specimen to be disarticulated. The pelvis was copiously irrigated and hemostasis was ensured. There was no evidence for rectal injury.  Attention then turned to the right pelvic sidewall. The fibrofatty tissue between the external iliac vein, confluence of the iliac vessels, hypogastric artery, and Cooper's ligament was dissected free from the pelvic sidewall with care to preserve the obturator nerve. Weck clips were used for lymphostasis and hemostasis. An identical procedure was performed on the contralateral side and the lymphatic packets were removed for permanent pathologic analysis.  Attention then turned to the urethral anastomosis. A 2-0 Vicryl slip knot was placed between Denonvillier's fascia, the posterior bladder neck, and the posterior urethra to reapproximate these structures. A double-armed 3-0 Monocryl suture was then used to perform a 360 running tension-free anastomosis between the bladder neck and urethra. A new urethral catheter was then placed into the bladder and irrigated. There were no blood clots within the bladder and the anastomosis appeared to be watertight. A #19 Blake drain was then brought through the left lateral 8 mm port site and positioned appropriately within the pelvis. It was secured to the skin with a nylon suture. The surgical cart was then undocked. The right lateral 12 mm port site was closed at the fascial level with a 0 Vicryl suture placed laparoscopically. All remaining ports were then removed under direct vision. The prostate specimen was removed intact within the Endopouch retrieval bag via the periumbilical camera port site. This  fascial opening was closed with two running 0 Vicryl sutures. 0.25% Marcaine was then injected into all port sites and all incisions were reapproximated at the skin level with 3-0 Monocryl subcuticular sutures. Dermabond was applied to the skin. The patient appeared to tolerate the procedure well and without complications. The patient was able to be extubated and transferred to the recovery unit in satisfactory condition.  Pryor Curia MD

## 2014-12-29 NOTE — Discharge Instructions (Signed)

## 2014-12-29 NOTE — Transfer of Care (Signed)
Immediate Anesthesia Transfer of Care Note  Patient: Carl Bowen  Procedure(s) Performed: Procedure(s): ROBOTIC ASSISTED LAPAROSCOPIC RADICAL PROSTATECTOMY LEVEL 2 (N/A) PELVIC LYMPHADENECTOMY (Bilateral)  Patient Location: PACU  Anesthesia Type:General  Level of Consciousness: awake, oriented, patient cooperative, lethargic and responds to stimulation  Airway & Oxygen Therapy: Patient Spontanous Breathing and Patient connected to face mask oxygen  Post-op Assessment: Report given to RN, Post -op Vital signs reviewed and stable and Patient moving all extremities  Post vital signs: Reviewed and stable  Last Vitals:  Filed Vitals:   12/29/14 0858  BP: 114/75  Pulse: 59  Temp: 36.6 C  Resp: 18    Complications: No apparent anesthesia complications

## 2014-12-29 NOTE — Anesthesia Procedure Notes (Signed)
Procedure Name: Intubation Date/Time: 12/29/2014 11:38 AM Performed by: Glory Buff Pre-anesthesia Checklist: Patient identified, Emergency Drugs available, Suction available and Patient being monitored Patient Re-evaluated:Patient Re-evaluated prior to inductionOxygen Delivery Method: Circle System Utilized Preoxygenation: Pre-oxygenation with 100% oxygen Intubation Type: IV induction Ventilation: Mask ventilation without difficulty Laryngoscope Size: Miller and 3 Grade View: Grade I Tube type: Oral Tube size: 7.5 mm Number of attempts: 1 Airway Equipment and Method: Stylet and Oral airway Placement Confirmation: ETT inserted through vocal cords under direct vision,  positive ETCO2 and breath sounds checked- equal and bilateral Secured at: 21 cm Tube secured with: Tape Dental Injury: Teeth and Oropharynx as per pre-operative assessment

## 2014-12-29 NOTE — Progress Notes (Signed)
Patient ID: Carl Bowen, male   DOB: April 21, 1956, 59 y.o.   MRN: [id number]  Post-op note  Subjective: The patient is doing well.  No complaints.  Objective: Vital signs in last 24 hours: Temp:  [97.5 F (36.4 C)-97.8 F (36.6 C)] 97.7 F (36.5 C) (04/04 1821) Pulse Rate:  [59-81] 62 (04/04 1821) Resp:  [12-18] 16 (04/04 1821) BP: (114-141)/(75-90) 134/83 mmHg (04/04 1821) SpO2:  [97 %-100 %] 98 % (04/04 1821) Weight:  [86.183 kg (190 lb)] 86.183 kg (190 lb) (04/04 0927)  Intake/Output from previous day:   Intake/Output this shift:    Physical Exam:  General: Alert and oriented. Abdomen: Soft, Nondistended. Incisions: Clean and dry. GU: Urine clear  Lab Results:  Recent Labs  12/29/14 1509  HGB 13.8  HCT 42.9    Assessment/Plan: POD#0   1) Continue to monitor, ambulate, IS   Pryor Curia. MD   LOS: 0 days   Najai Waszak,LES 12/29/2014, 8:35 PM

## 2014-12-29 NOTE — Interval H&P Note (Signed)
History and Physical Interval Note:  12/29/2014 10:43 AM  Carl Bowen  has presented today for surgery, with the diagnosis of PROSTATE CANCER  The various methods of treatment have been discussed with the patient and family. After consideration of risks, benefits and other options for treatment, the patient has consented to  Procedure(s): ROBOTIC ASSISTED LAPAROSCOPIC RADICAL PROSTATECTOMY LEVEL 2 (N/A) PELVIC LYMPHADENECTOMY (Bilateral) as a surgical intervention .  The patient's history has been reviewed, patient examined, no change in status, stable for surgery.  I have reviewed the patient's chart and labs.  Questions were answered to the patient's satisfaction.     Bianney Rockwood,LES

## 2014-12-29 NOTE — Anesthesia Preprocedure Evaluation (Signed)
Anesthesia Evaluation  Patient identified by MRN, date of birth, ID band Patient awake    Reviewed: Allergy & Precautions, H&P , NPO status , Patient's Chart, lab work & pertinent test results  Airway Mallampati: II  TM Distance: >3 FB Neck ROM: full    Dental  (+) Missing, Dental Advisory Given, Poor Dentition Almost all teeth are missing.  Just a few left:   Pulmonary COPDCurrent Smoker,  Mild COPD per CXR breath sounds clear to auscultation  Pulmonary exam normal       Cardiovascular Exercise Tolerance: Good negative cardio ROS  Rhythm:regular Rate:Normal     Neuro/Psych negative neurological ROS  negative psych ROS   GI/Hepatic negative GI ROS, Neg liver ROS,   Endo/Other  negative endocrine ROS  Renal/GU negative Renal ROS  negative genitourinary   Musculoskeletal   Abdominal   Peds  Hematology negative hematology ROS (+)   Anesthesia Other Findings   Reproductive/Obstetrics negative OB ROS                             Anesthesia Physical Anesthesia Plan  ASA: II  Anesthesia Plan: General   Post-op Pain Management:    Induction: Intravenous  Airway Management Planned: Oral ETT  Additional Equipment:   Intra-op Plan:   Post-operative Plan: Extubation in OR  Informed Consent: I have reviewed the patients History and Physical, chart, labs and discussed the procedure including the risks, benefits and alternatives for the proposed anesthesia with the patient or authorized representative who has indicated his/her understanding and acceptance.   Dental Advisory Given  Plan Discussed with: CRNA and Surgeon  Anesthesia Plan Comments:         Anesthesia Quick Evaluation

## 2014-12-30 LAB — HEMOGLOBIN AND HEMATOCRIT, BLOOD
HCT: 41.2 % (ref 39.0–52.0)
HEMOGLOBIN: 13.3 g/dL (ref 13.0–17.0)

## 2014-12-30 MED ORDER — HYDROCODONE-ACETAMINOPHEN 5-325 MG PO TABS: 1.0000 | ORAL_TABLET | Freq: Four times a day (QID) | ORAL | Status: DC | PRN

## 2014-12-30 MED ORDER — BELLADONNA ALKALOIDS-OPIUM 16.2-60 MG RE SUPP
1.0000 | Freq: Once | RECTAL | Status: AC
  Administered 2014-12-30: 1 via RECTAL
  Filled 2014-12-30: qty 1

## 2014-12-30 MED ORDER — BISACODYL 10 MG RE SUPP
10.0000 mg | Freq: Once | RECTAL | Status: AC
  Administered 2014-12-30: 10 mg via RECTAL
  Filled 2014-12-30: qty 1

## 2014-12-30 MED ORDER — CIPROFLOXACIN HCL 500 MG PO TABS: 500.0000 mg | ORAL_TABLET | Freq: Two times a day (BID) | ORAL | Status: DC

## 2014-12-30 NOTE — Progress Notes (Signed)
Utilization review completed.  

## 2014-12-30 NOTE — Care Management Note (Signed)
    Page 1 of 1   12/30/2014     11:29:46 AM CARE MANAGEMENT NOTE 12/30/2014  Patient:  Carl Bowen, Carl Bowen   Account Number:  [id number]  Date Initiated:  12/30/2014  Documentation initiated by:  Sunday Spillers  Subjective/Objective Assessment:   59 yo male admitted s/p prostatectomy. PTA lived at home alone     Action/Plan:   Home when stable   Anticipated DC Date:  12/30/2014   Anticipated DC Plan:  [place]  CM consult      Choice offered to / List presented to:             Status of service:  Completed, signed off Medicare Important Message given?   (If response is "NO", the following Medicare IM given date fields will be blank) Date Medicare IM given:   Medicare IM given by:   Date Additional Medicare IM given:   Additional Medicare IM given by:    Discharge Disposition:  HOME/SELF CARE  Per UR Regulation:  Reviewed for med. necessity/level of care/duration of stay  If discussed at [facility] of Stay Meetings, dates discussed:    Comments:

## 2014-12-30 NOTE — Discharge Summary (Signed)
  Date of admission: 12/29/2014  Date of discharge: 12/30/2014  Admission diagnosis: Prostate Cancer  Discharge diagnosis: Prostate Cancer  History and Physical: For full details, please see admission history and physical. Briefly, Carl Bowen is a 59 y.o. gentleman with localized prostate cancer.  After discussing management/treatment options, he elected to proceed with surgical treatment.  Hospital Course: Carl Bowen was taken to the operating room on 12/29/2014 and underwent a robotic assisted laparoscopic radical prostatectomy. He tolerated this procedure well and without complications. Postoperatively, he was able to be transferred to a regular hospital room following recovery from anesthesia.  He was able to begin ambulating the night of surgery. He remained hemodynamically stable overnight.  He had excellent urine output with appropriately minimal output from his pelvic drain and his pelvic drain was removed on POD #1.  He was transitioned to oral pain medication, tolerated a clear liquid diet, and had met all discharge criteria and was able to be discharged home later on POD#1.  Laboratory values:  Recent Labs  12/29/14 1509 12/30/14 0510  HGB 13.8 13.3  HCT 42.9 41.2    Disposition: Home  Discharge instruction: He was instructed to be ambulatory but to refrain from heavy lifting, strenuous activity, or driving. He was instructed on urethral catheter care.  Discharge medications:     Medication List    STOP taking these medications        tamsulosin 0.4 MG Caps capsule  Commonly known as:  FLOMAX      TAKE these medications        acetaminophen 500 MG tablet  Commonly known as:  TYLENOL  Take 1,000 mg by mouth every 6 (six) hours as needed for moderate pain or headache.     ciprofloxacin 500 MG tablet  Commonly known as:  CIPRO  Take 1 tablet (500 mg total) by mouth 2 (two) times daily. Start day prior to office visit for foley removal     HYDROcodone-acetaminophen 5-325 MG per tablet  Commonly known as:  NORCO  Take 1-2 tablets by mouth every 6 (six) hours as needed.        Followup: He will followup in 1 week for catheter removal and to discuss his surgical pathology results.

## 2014-12-30 NOTE — Progress Notes (Signed)
Went over all discharge instructions with pt. Did foley and leg bag teaching.  Reviewed medications and importance of taken them as prescribed.  VSS.  Pt wheeled out by NT.

## 2014-12-30 NOTE — Progress Notes (Signed)
Patient ID: Carl Bowen, male   DOB: [birth year], 59 y.o.   MRN: [id number]  1 Day Post-Op Subjective: The patient is doing well.  No nausea or vomiting. Pain is adequately controlled.  Objective: Vital signs in last 24 hours: Temp:  [97.5 F (36.4 C)-98.4 F (36.9 C)] 98.4 F (36.9 C) (04/05 0548) Pulse Rate:  [56-81] 62 (04/05 0548) Resp:  [12-18] 14 (04/05 0548) BP: (114-141)/(71-90) 117/71 mmHg (04/05 0548) SpO2:  [96 %-100 %] 96 % (04/05 0548) Weight:  [86.183 kg (190 lb)] 86.183 kg (190 lb) (04/04 0927)  Intake/Output from previous day: 04/04 0701 - 04/05 0700 In: 1700 [I.V.:1700] Out: 2785 [Urine:2700; Drains:35; Blood:50] Intake/Output this shift:    Physical Exam:  General: Alert and oriented. CV: RRR Lungs: Clear bilaterally. GI: Soft, Nondistended. Incisions: Clean, dry, and intact Urine: Clear Extremities: Nontender, no erythema, no edema.  Lab Results:  Recent Labs  12/29/14 1509 12/30/14 0510  HGB 13.8 13.3  HCT 42.9 41.2      Assessment/Plan: POD# 1 s/p robotic prostatectomy.  1) SL IVF 2) Ambulate, Incentive spirometry 3) Transition to oral pain medication 4) Dulcolax suppository 5) D/C pelvic drain 6) Plan for likely discharge later today   Pryor Curia. MD   LOS: 1 day   Jordayn Mink,LES 12/30/2014, 7:12 AM

## 2014-12-31 ENCOUNTER — Encounter (HOSPITAL_COMMUNITY): Payer: Self-pay | Admitting: Urology

## 2015-01-30 DIAGNOSIS — M6281 Muscle weakness (generalized): Secondary | ICD-10-CM | POA: Diagnosis not present

## 2015-01-30 DIAGNOSIS — C61 Malignant neoplasm of prostate: Secondary | ICD-10-CM | POA: Diagnosis not present

## 2015-02-13 DIAGNOSIS — M6281 Muscle weakness (generalized): Secondary | ICD-10-CM | POA: Diagnosis not present

## 2015-02-13 DIAGNOSIS — C61 Malignant neoplasm of prostate: Secondary | ICD-10-CM | POA: Diagnosis not present

## 2015-03-04 DIAGNOSIS — M6281 Muscle weakness (generalized): Secondary | ICD-10-CM | POA: Diagnosis not present

## 2015-03-04 DIAGNOSIS — C61 Malignant neoplasm of prostate: Secondary | ICD-10-CM | POA: Diagnosis not present

## 2015-03-11 DIAGNOSIS — C61 Malignant neoplasm of prostate: Secondary | ICD-10-CM | POA: Diagnosis not present

## 2015-03-11 DIAGNOSIS — N393 Stress incontinence (female) (male): Secondary | ICD-10-CM | POA: Diagnosis not present

## 2015-04-01 DIAGNOSIS — M6281 Muscle weakness (generalized): Secondary | ICD-10-CM | POA: Diagnosis not present

## 2015-04-01 DIAGNOSIS — C61 Malignant neoplasm of prostate: Secondary | ICD-10-CM | POA: Diagnosis not present

## 2015-04-09 IMAGING — CR DG CERVICAL SPINE 2 OR 3 VIEWS
4 series · 4 of 4 positions shown · non-contrast
Comparison: Nuclear medicine bone scan December 17, 2014

CLINICAL DATA: Patient with prostate carcinoma with radiotracer
uptake increase in lower cervical spine on recent bone scan

EXAM:
CERVICAL SPINE - 2-3 VIEW

[w c-spine lat *]
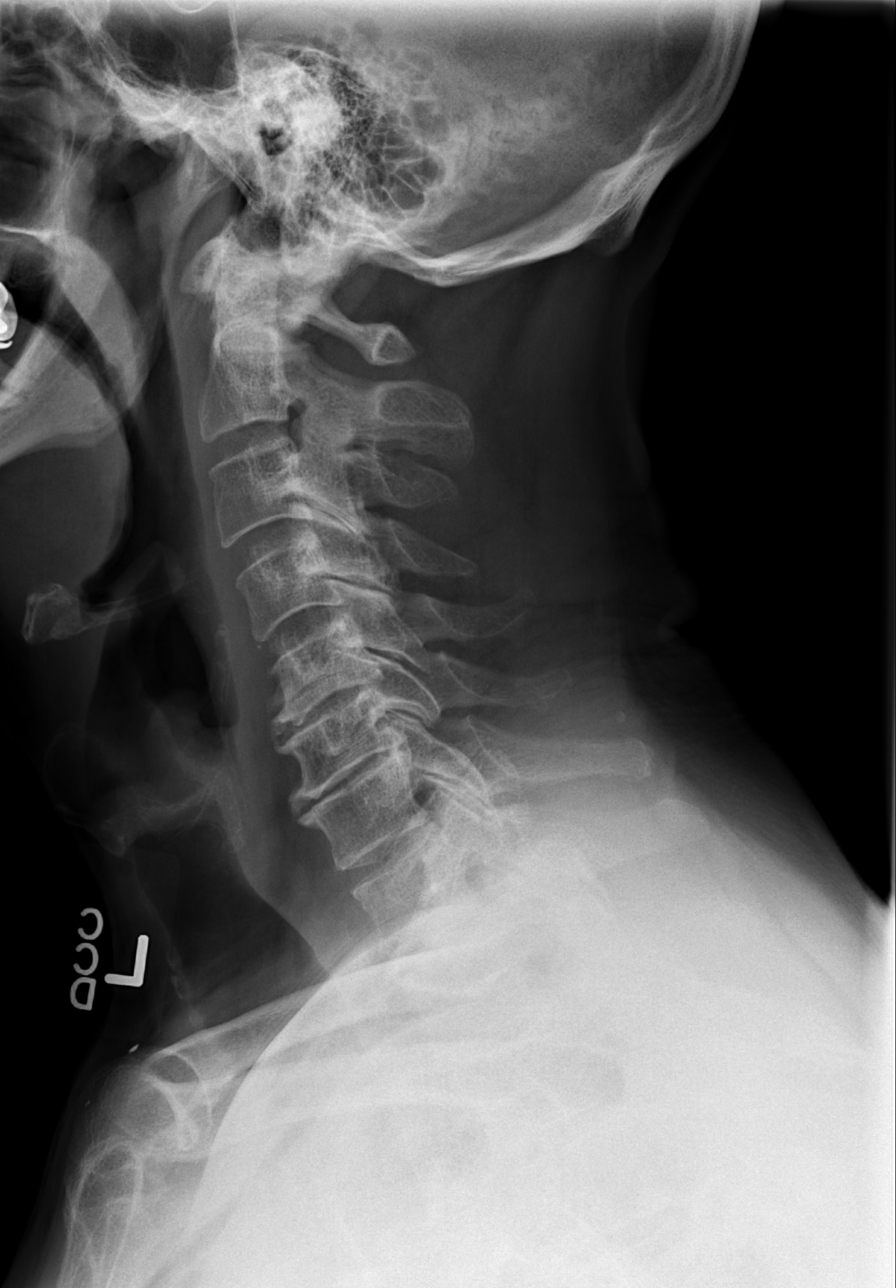

[w c-spine a.p. *]
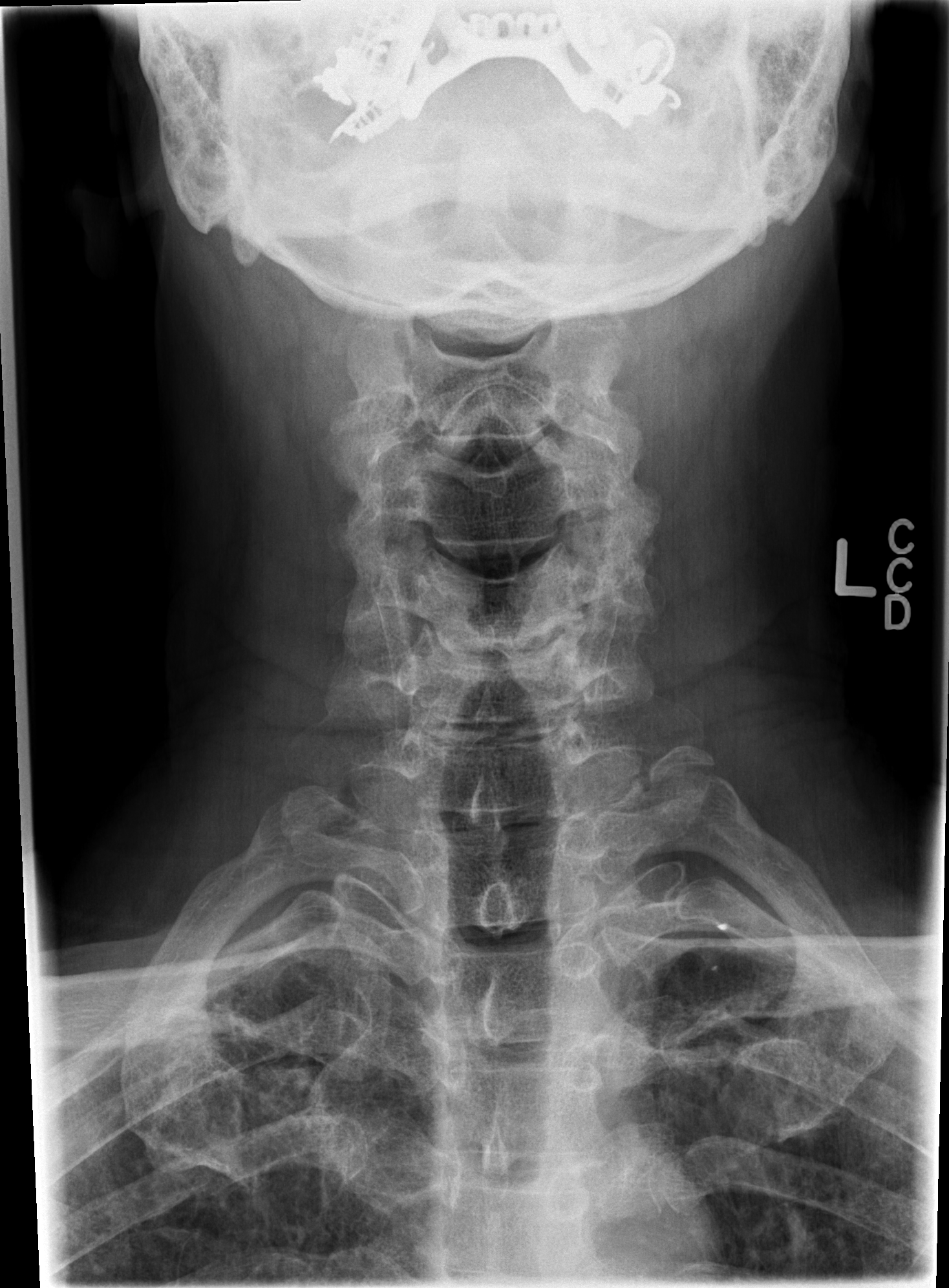

[w c-spine odontoid * (1 of 2)]
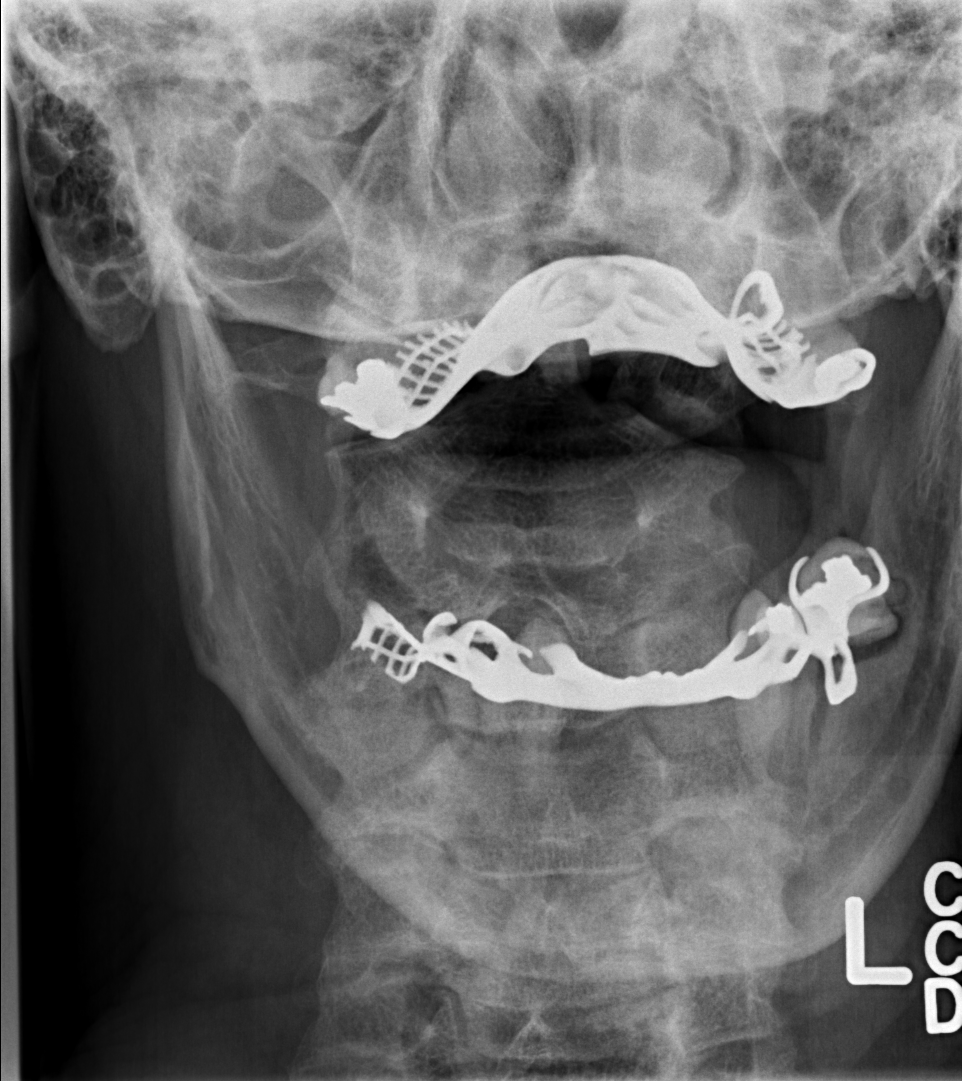

[w c-spine odontoid * (2 of 2)]
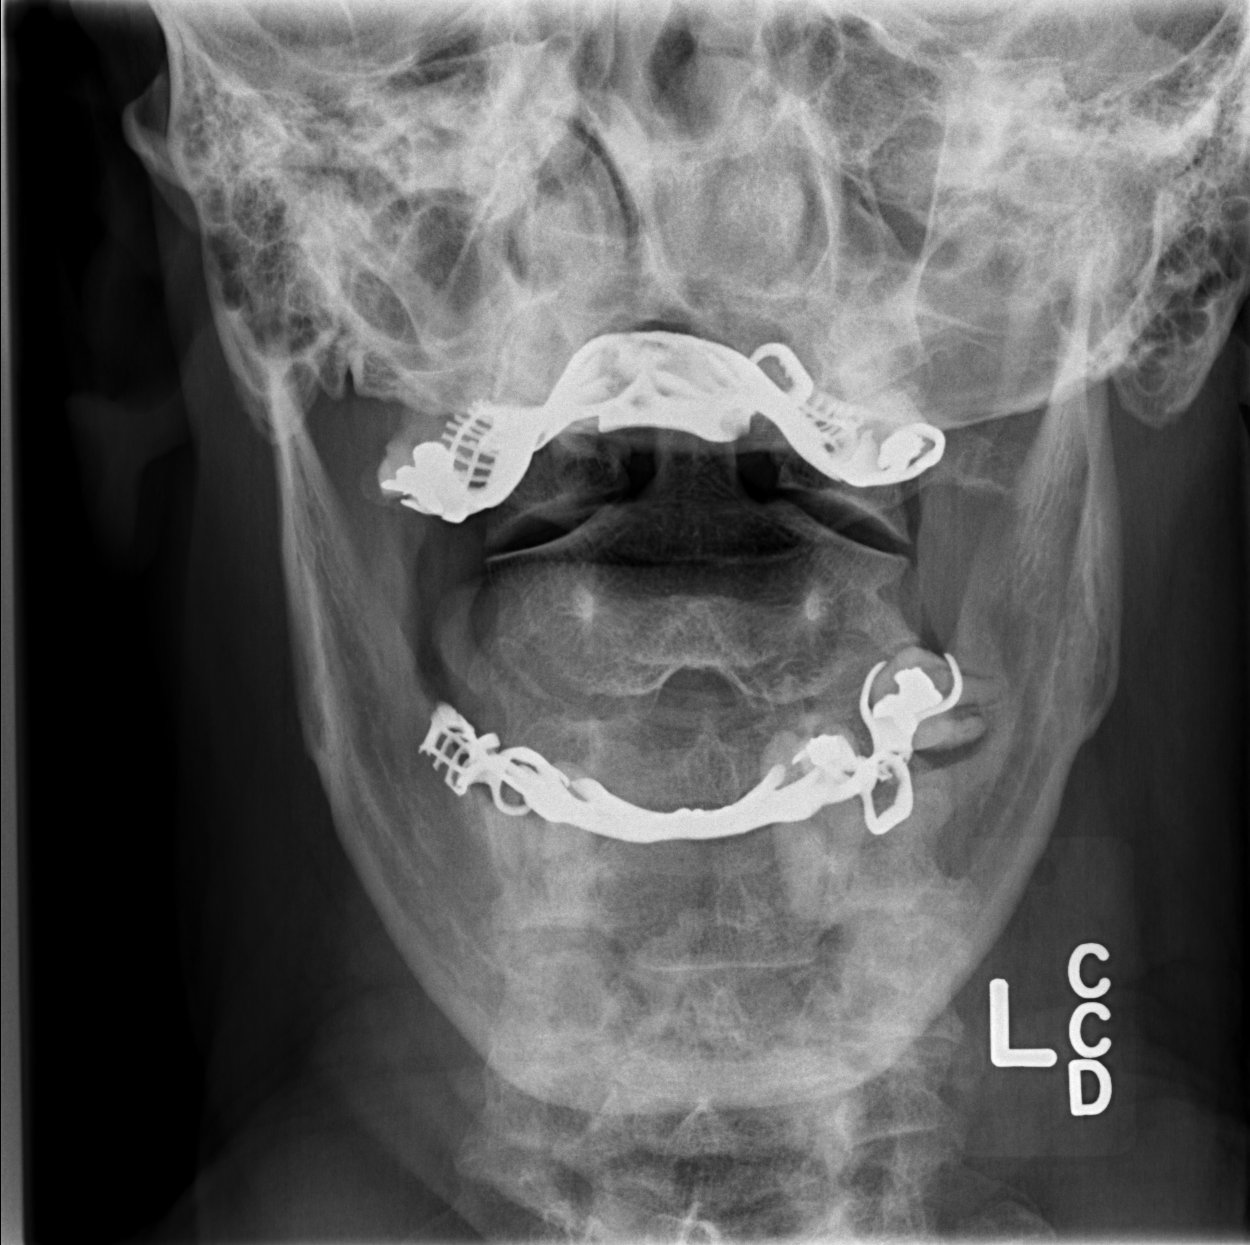

[4 of 4 positions shown; findings below may reference images not displayed]

FINDINGS: Frontal, lateral, and open-mouth odontoid images were obtained.
There is no fracture or spondylolisthesis. Prevertebral soft tissues
and predental space regions are normal. There is moderately severe
disc space narrowing at C5-6 and C6-7. There is slightly less
narrowing at C7-T1 and T1-2. There are prominent anterior
osteophytes at C5, C6, and C7. There are no blastic or lytic bone
lesions.
IMPRESSION: Osteoarthritic change in the lower cervical region. The increased
radiotracer uptake on recent bone scan this areas probably due to
the arthropathy. No blastic or lytic bone lesions are apparent
radiographically. No fracture or spondylolisthesis.

## 2015-05-13 DIAGNOSIS — M6281 Muscle weakness (generalized): Secondary | ICD-10-CM | POA: Diagnosis not present

## 2015-05-13 DIAGNOSIS — C61 Malignant neoplasm of prostate: Secondary | ICD-10-CM | POA: Diagnosis not present

## 2015-05-22 ENCOUNTER — Telehealth: Payer: Self-pay | Admitting: Medical Oncology

## 2015-05-22 NOTE — Telephone Encounter (Signed)
Oncology Nurse Navigator Documentation  Oncology Nurse Navigator Flowsheets 05/22/2015  Referral date to RadOnc/MedOnc 05/20/2015  Navigator Encounter Type Introductory phone call  Time Spent with Patient 15   I called pt to introduce myself as the Prostate Nurse Navigator and the Coordinator of the [facility] [place].  1. I confirmed with the patient he is aware of his referral to the clinic 06/05/15 arriving at 7:30am.  2. I discussed the format of the clinic and the physicians he will be seeing that day.  3. I discussed where the clinic is located and how to contact me.  4. I confirmed his address and informed him I would be mailing a packet of information and forms to be completed. I asked him to bring them with him the day of his appointment.   He voiced understanding of the above. I asked him to call me if he has any questions or concerns regarding his appointments or the forms he needs to complete.

## 2015-06-03 DIAGNOSIS — C61 Malignant neoplasm of prostate: Secondary | ICD-10-CM | POA: Diagnosis not present

## 2015-06-03 DIAGNOSIS — M6281 Muscle weakness (generalized): Secondary | ICD-10-CM | POA: Diagnosis not present

## 2015-06-04 ENCOUNTER — Telehealth: Payer: Self-pay | Admitting: Medical Oncology

## 2015-06-04 ENCOUNTER — Encounter: Payer: Self-pay | Admitting: Radiation Oncology

## 2015-06-04 NOTE — Progress Notes (Signed)
GU Location of Tumor / Histology: prostatic adenocarcinoma  If Prostate Cancer, Gleason Score is (4 + 3) and PSA is (13.76)pretreatment. 03/04/2015 (post treatment) PSA 0.11.   Theodore Demark was referred to Dr. Alinda Money with an elevated PSA.    Past/Anticipated interventions by urology, if any: NNS RAL and BPLND on 12/29/14. Positive surgical margin and lymph node thus, referral to [facility].  Past/Anticipated interventions by medical oncology, if any: to be seen by Dr. Alen Blew in [facility]  Weight changes, if any: no  Bowel/Bladder complaints, if any: urinary incontinence using approximately 4 pads per day, erectile dysfunction   Nausea/Vomiting, if any: no  Pain issues, if any:  Lower back pain that radiates into his groin  SAFETY ISSUES:  Prior radiation? no  Pacemaker/ICD? no  Possible current pregnancy? no  Is the patient on methotrexate? no  Current Complaints / other details:  59 year old male. Divorced. Resides with and is the primary caregiver for his  mother and daughter who underwent a heart transplant nine years ago.

## 2015-06-04 NOTE — Telephone Encounter (Signed)
Oncology Nurse Navigator Documentation  Oncology Nurse Navigator Flowsheets 05/22/2015 06/04/2015  Referral date to RadOnc/MedOnc 05/20/2015 -  Navigator Encounter Type Introductory phone call Telephone- requested a return call to confirm his appointment for the [facility] [place] 9/9 arriving at 7:30am. I reminded him to bring his completed medical forms.  Time Spent with Patient 15 (No Data)

## 2015-06-04 NOTE — Telephone Encounter (Signed)
Oncology Nurse Navigator Documentation  Oncology Nurse Navigator Flowsheets 05/22/2015 06/04/2015 06/04/2015  Referral date to RadOnc/MedOnc 05/20/2015 - -  Navigator Encounter Type Introductory phone call Telephone Telephone- Carl Bowen called to confirm his appointment for 9/09 in the [facility] [place] arriving at 7:30am. He states he knows where the cancer center is and will bring the completed medical forms.  Time Spent with Patient 15 (No Data) 30

## 2015-06-05 ENCOUNTER — Ambulatory Visit (HOSPITAL_BASED_OUTPATIENT_CLINIC_OR_DEPARTMENT_OTHER): Payer: Medicare Other | Admitting: Oncology

## 2015-06-05 ENCOUNTER — Ambulatory Visit
Admission: RE | Admit: 2015-06-05 | Discharge: 2015-06-05 | Disposition: A | Discharge status: Home / Self Care | Payer: Medicare Other | Source: Ambulatory Visit | Attending: Radiation Oncology | Admitting: Radiation Oncology

## 2015-06-05 ENCOUNTER — Encounter: Payer: Self-pay | Admitting: Adult Health

## 2015-06-05 ENCOUNTER — Encounter: Payer: Self-pay | Admitting: General Practice

## 2015-06-05 ENCOUNTER — Encounter: Payer: Self-pay | Admitting: Medical Oncology

## 2015-06-05 ENCOUNTER — Encounter: Payer: Self-pay | Admitting: Radiation Oncology

## 2015-06-05 VITALS — BP 111/79 | HR 62 | Temp 98.0°F | Resp 16 | Ht 71.0 in | Wt 197.0 lb

## 2015-06-05 DIAGNOSIS — N393 Stress incontinence (female) (male): Secondary | ICD-10-CM | POA: Diagnosis not present

## 2015-06-05 DIAGNOSIS — C61 Malignant neoplasm of prostate: Secondary | ICD-10-CM

## 2015-06-05 DIAGNOSIS — N529 Male erectile dysfunction, unspecified: Secondary | ICD-10-CM | POA: Diagnosis not present

## 2015-06-05 NOTE — Consult Note (Signed)
Reason For Visit Location of consult: [facility]   History of Present Illness [name] is a 59 year old gentleman who had been found to have an elevated PSA of 13.76 and a prostate biopsy by Dr. Exie Parody in [place], [place] demonstrated 15 out of 15 biopsy cores positive for Gleason 4+3=7 adenocarcinoma. Due to the patient's strong preference as well as significant baseline voiding symptoms (IPSS of 20 despite tamsulosin), he elected primary surgical therapy. He was treated prostate with a NNS RAL radical prostatectomy and BPLND on 12/29/14.    ** His main medical issues are spinal stenosis and chronic back pain. 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    Diagnosis: pT3b N1 Mx, Gleason 4+3=7 adenocarcinoma with multiple positive surgical margins (1/9 positive lymph nodes)  Pretreatment PSA: 13.76  Pretreatment SHIM: 1    Interval history:    He follows up today at the multidisciplinary clinic to further discuss his options for ongoing management of his lymph node-positive prostate cancer. He continues to make progress with regard to his continence and is typically using 1 pad per day which he describes as mildly damp. He is dry overnight.     Past Medical History Problems  1. History of back problems 2. History of spinal stenosis (Z87.39)  Surgical History Problems  1. History of Laparoscopy With Bilateral Total Pelvic Lymphadenectomy 2. History of Prostatect Retropubic Radical W/ Nerve Sparing Laparoscopic  Current Meds 1. No Reported Medications Recorded  Allergies Medication  1. No Known Drug Allergies  Family History Problems  1. Family history of cardiac disorder (Z82.49) : Daughter 2. Family history of diabetes mellitus (Z83.3) : Mother, Father 3. Family history of Heart transplanted : Daughter 4. Family history of  Urinary calculus : Brother  Social History Problems  1. Denied: History of Alcohol use 2. Current every day smoker (F17.200) 3. Divorced  Physical Exam Constitutional: Well nourished and well developed . No acute distress.    Results/Data Selected Results  PSA 07Sep2016 11:01AM [name]  SPECIMEN TYPE: BLOOD   Test Name Result Flag Reference  PSA 0.18 ng/mL  <=4.00  TEST METHODOLOGY: ECLIA PSA (ELECTROCHEMILUMINESCENCE IMMUNOASSAY)   PSA [id number] 12:07PM [name]  SPECIMEN TYPE: BLOOD4/14/16 - [place] TODAY/TIME BLOCKED ON 6/15 @ 1:00 P   Test Name Result Flag Reference  PSA 0.11 ng/mL  <=4.00  RESULT REPEATED AND VERIFIED. TEST METHODOLOGY: ECLIA PSA (ELECTROCHEMILUMINESCENCE IMMUNOASSAY)    We have reviewed his pathology slides and imaging studies in conference today. Findings are as previously discussed.  Assessment  Assessed  1. Erectile dysfunction (N52.9) 2. Male stress incontinence (N39.3)  Prostate cancer (185) (C61)   Discussion/Summary 1. Lymph node-positive prostate cancer with rising PSA: Mr. Lichtman has had a detailed discussion with Dr. Alen Blew and Dr. Tammi Klippel today regarding options for treatment. We have all discussed his situation is a multidisciplinary group as well. He does require further therapy and does have concern for local and systemic recurrence. Considering his young age and excellent life expectancy, we did discuss proceeding with therapy of curative intent and he does appear to be an appropriate candidate for salvage radiation therapy in combination with systemic androgen deprivation. We have agreed to proceed with treatment with androgen deprivation for 2 years. I did have a discussion with him today regarding the potential side effects of therapy and preventative measures that  are recommended. He will begin osteoporosis prophylaxis. We will get him scheduled for nurse visit for Lupron 45 mg. He will tentatively be scheduled for his CT  simulation for radiation therapy on November 11 per Dr. Tammi Klippel. In addition, he will continue to work on his physical therapy exercises and should be totally continent by then. If not, his radiation therapy may be delayed slightly. All questions were answered to his stated satisfaction. He will tentatively be scheduled to see me in 6 months for his next PSA and to continue androgen deprivation.    Cc: Dr. Zola Button  Dr. Tyler Pita   A total of 25 minutes were spent in the overall care of the patient today with 25 minutes in direct face to face consultation.    Signatures Electronically signed by : [name], M.D.; Jun 05 2015 10:19AM EST

## 2015-06-05 NOTE — Progress Notes (Signed)
   12/29/14  RA-LRP

## 2015-06-05 NOTE — Progress Notes (Signed)
[name] is a very pleasant 59 y.o. gentleman from [place], [place] with recently diagnosed prostate adenocarcinoma.    He presents today to the [facility]) for treatment consideration and recommendations from the urologist/surgeon, radiation oncologist, and medical oncologist.    I briefly met with [name] during his [place] visit today. We discussed the purpose of the Survivorship Clinic, which will include monitoring for recurrence, coordinating completion of age and gender-appropriate cancer screenings, promotion of overall wellness, as well as managing potential late/long-term side effects of anti-cancer treatments.     [name] is s/p radical prostatectomy. The treatment plan for [name] going forward will include radiation therapy and androgen deprivation therapy.  His staging scans for distant metastasis were negative.  As of today, the intent of treatment for [name] is cure/local control, therefore he will be eligible for the Survivorship Clinic upon his completion of treatment.  His survivorship care plan (SCP) will be reviewed with him during an in-person visit with myself once he has completed treatment.    [name] was encouraged to ask questions and all questions were answered to his satisfaction.  He was given my business card and encouraged to contact me with any concerns regarding survivorship.  I look forward to participating in his care.   Carl Craze, NP [place] [phone]

## 2015-06-05 NOTE — Progress Notes (Signed)
Radiation Oncology         [phone] ________________________________  Multidisciplinary Prostate Cancer Clinic  Initial Radiation Oncology Consultation  Name: Carl Bowen MRN: [id number]  Date: 06/05/2015  DOB: 1956-03-11  CC:No PCP Per Patient  [name], MD   REFERRING PHYSICIAN: [name], MD  DIAGNOSIS: 59 y.o. gentleman with detectable rising PSA of 0.18 s/p prostatectomy with high risk pathology features    ICD-9-CM ICD-10-CM   1. Prostate cancer [place] ILLNESS::Carl Bowen is a 59 y.o. gentleman.  He was noted to have an elevated PSA of 13.76 by his primary care physician.  Accordingly, he was referred for evaluation in urology by Dr. Exie Parody and digital rectal examination was performed at that time revealing no nodules.  The patient proceeded to transrectal ultrasound with 12 biopsies of the prostate on 07/31/14.  Out of 15 core biopsies,15 were positive.  The maximum Gleason score was 4+3.  The patient had an RA-LRP on 12/29/14 with Dr. Alinda Money, revealing high risk features:     He is still recovering from incontinence after surgery, down to 1 pad per day. PSA level taken post-op 03/04/15 was 0.11. This week's PSA level was 0.18.   The patient reviewed the pathology and PSA results with his urologist and he has kindly been referred today to the multidisciplinary prostate cancer clinic for presentation of pathology and radiology studies in our conference for discussion of potential radiation treatment options and clinical evaluation.  PREVIOUS RADIATION THERAPY: No  PAST MEDICAL HISTORY:  has a past medical history of Neuropathy; Pneumonia; DDD (degenerative disc disease), lumbar; Lumbar herniated disc; Lumbar stenosis; Cancer; DDD (degenerative disc disease), lumbar; DDD (degenerative disc disease), lumbar; GERD (gastroesophageal reflux disease); COPD (chronic obstructive pulmonary disease); and Prostate cancer.    PAST SURGICAL  HISTORY: Past Surgical History  Procedure Laterality Date  . Robot assisted laparoscopic radical prostatectomy N/A 12/29/2014    Procedure: ROBOTIC ASSISTED LAPAROSCOPIC RADICAL PROSTATECTOMY LEVEL 2;  Surgeon: [name], MD;  Location: [facility];  Service: Urology;  Laterality: N/A;  . Lymphadenectomy Bilateral 12/29/2014    Procedure: PELVIC LYMPHADENECTOMY;  Surgeon: [name], MD;  Location: [facility];  Service: Urology;  Laterality: Bilateral;  . Prostate biopsy      FAMILY HISTORY: family history includes Cancer in his maternal grandmother and mother.  SOCIAL HISTORY:  reports that he has been smoking Cigarettes.  He has a 45 pack-year smoking history. He has never used smokeless tobacco. He reports that he drinks alcohol. He reports that he does not use illicit drugs.  ALLERGIES: Review of patient's allergies indicates no known allergies.  MEDICATIONS:  Current Outpatient Prescriptions  Medication Sig Dispense Refill  . acetaminophen (TYLENOL) 500 MG tablet Take 1,000 mg by mouth every 6 (six) hours as needed for moderate pain or headache.     No current facility-administered medications for this encounter.    REVIEW OF SYSTEMS:  A 15 point review of systems is documented in the electronic medical record. This was obtained by the nursing staff. However, I reviewed this with the patient to discuss relevant findings and make appropriate changes.  A comprehensive review of systems was negative..  The patient was asked to complete an IPSS and IIEF questionnaire but, responded that neither were relevant.    PHYSICAL EXAM: This patient is in no acute distress.  He is alert and oriented.   height is 5\' 11"  (1.803 m) and weight is 197 lb (  89.359 kg). His oral temperature is 98 F (36.7 C). His blood pressure is 111/79 and his pulse is 62. His respiration is 16 and oxygen saturation is 96%.  He exhibits no respiratory distress or labored breathing.  He appears neurologically intact.  His mood  is pleasant.  His affect is appropriate.  Please note the digital rectal exam findings described above.  KPS = 100  100 - Normal; no complaints; no evidence of disease. 90   - Able to carry on normal activity; minor signs or symptoms of disease. 80   - Normal activity with effort; some signs or symptoms of disease. 44   - Cares for self; unable to carry on normal activity or to do active work. 60   - Requires occasional assistance, but is able to care for most of his personal needs. 50   - Requires considerable assistance and frequent medical care. 29   - Disabled; requires special care and assistance. 59   - Severely disabled; hospital admission is indicated although death not imminent. 18   - Very sick; hospital admission necessary; active supportive treatment necessary. 10   - Moribund; fatal processes progressing rapidly. 0     - Dead  Karnofsky DA, Abelmann [place], Craver LS and Burchenal [facility] [phone]) The use of the nitrogen mustards in the palliative treatment of carcinoma: with particular reference to bronchogenic carcinoma Cancer 1 [id number]   LABORATORY DATA:  Lab Results  Component Value Date   WBC 11.8* 12/23/2014   HGB 13.3 12/30/2014   HCT 41.2 12/30/2014   MCV 85.7 12/23/2014   PLT 210 12/23/2014   Lab Results  Component Value Date   NA 135 12/23/2014   K 4.4 12/23/2014   CL 104 12/23/2014   CO2 24 12/23/2014   No results found for: ALT, AST, GGT, ALKPHOS, BILITOT   RADIOGRAPHY: No results found.    IMPRESSION: This gentleman is a pleasant 59 y.o. gentleman with detectable rising PSA of 0.18 s/p prostatectomy with high risk pathology features.  Accordingly he may benefit from salvage radiation therapy and androgen deprivation.   PLAN: Today, I talked to the patient and family about the findings and work-up thus far.  We discussed the natural history of post-prostatectomy rising PSA and general treatment, highlighting the role of radiotherapy in the management.  We  discussed the available radiation techniques, and focused on the details of logistics and delivery.  We reviewed the anticipated acute and late sequelae associated with radiation in this setting.  The patient was encouraged to ask questions that I answered to the best of my ability.  I filled out a patient counseling form during our discussion including treatment diagrams.  We retained a copy for our records.    The patient would like to proceed with radiation and has been scheduled for CT simulation on 08/07/15, allowing for continued urinary recovery while receiving androgen deprivation.  I spent 60 minutes minutes face to face with the patient and more than 50% of that time was spent in counseling and/or coordination of care.    This document serves as a record of services personally performed by Tyler Pita, MD. It was created on his behalf by Arlyce Harman, a trained medical scribe. The creation of this record is based on the scribe's personal observations and the provider's statements to them. This document has been checked and approved by the attending provider.     ------------------------------------------------  Sheral Apley Tammi Klippel, M.D.

## 2015-06-05 NOTE — Progress Notes (Signed)
Please see consult note.  

## 2015-06-05 NOTE — Consult Note (Signed)
Reason for Referral: Prostate cancer.  HPI: 59 year old gentleman native of [place] where he lived the majority of his life. He is currently on medical disability related to chronic back issues including spinal stenosis. 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 He is usually in reasonable health but presented with obstructive symptoms and difficulty urination and was evaluated by his primary care physician and found to have a PSA is up to 13.76. He subsequently underwent a biopsy and showed that he had a Gleason score 4+3 = 7 prostate adenocarcinoma. He was evaluated by Dr. Alinda Money and underwent radical prostatectomy on 12/29/2014. The prostate pathology showed adenocarcinoma Gleason score 4+3 = 7 with tumor involved the bladder neck and the seminal vesicles. One out of 4 lymph nodes involved in the right pelvic area was 0 out of 5 involved in the left pelvic area. The final pathology was T3bN1 . His staging workup included a CT scan and a bone scan was unremarkable. His PSA postoperatively was 0.11 and most recently up to 0.18. Patient was referred to prostate cancer multidisciplinary clinic for discussion. Clinically, he reports feeling relatively well. He does have symptoms of incontinence but have improved dramatically. He uses about 1 pad a day. He is able to urinate freely most of the time. He does not report any obstructive symptoms at this time.  He does not report any headaches, blurry vision, syncope or seizures. He does not report any fevers, chills, sweats or weight loss. He is not reporting any chest pain, palpitation orthopnea. He does not report any cough or hemoptysis or hematemesis. Does not report any nausea, vomiting or abdominal pain. Does not report any hematuria or dysuria. Does report chronic back pain which is have not changed. Remaining review of systems unremarkable.   Past Medical History  Diagnosis Date  .  Neuropathy     bilateral lower  legs with sitting on occasion  . Pneumonia   . DDD (degenerative disc disease), lumbar   . Lumbar herniated disc   . Lumbar stenosis   . Cancer     prostate  . DDD (degenerative disc disease), lumbar   . DDD (degenerative disc disease), lumbar   . GERD (gastroesophageal reflux disease)   . COPD (chronic obstructive pulmonary disease)     12/23/14  per chest x ray  . Prostate cancer   :  Past Surgical History  Procedure Laterality Date  . Robot assisted laparoscopic radical prostatectomy N/A 12/29/2014    Procedure: ROBOTIC ASSISTED LAPAROSCOPIC RADICAL PROSTATECTOMY LEVEL 2;  Surgeon: [name], MD;  Location: [facility];  Service: Urology;  Laterality: N/A;  . Lymphadenectomy Bilateral 12/29/2014    Procedure: PELVIC LYMPHADENECTOMY;  Surgeon: [name], MD;  Location: [facility];  Service: Urology;  Laterality: Bilateral;  . Prostate biopsy    :   Current outpatient prescriptions:  .  acetaminophen (TYLENOL) 500 MG tablet, Take 1,000 mg by mouth every 6 (six) hours as needed for moderate pain or headache., Disp: , Rfl:  .  ciprofloxacin (CIPRO) 500 MG tablet, Take 1 tablet (500 mg total) by mouth 2 (two) times daily. Start day prior to office visit for foley removal, Disp: 6 tablet, Rfl: 0 .  HYDROcodone-acetaminophen (NORCO) 5-325 MG per tablet, Take 1-2 tablets by mouth every 6 (six) hours as needed., Disp: 30 tablet, Rfl: 0:  No Known Allergies:  Family History  Problem Relation Age of Onset  .  Cancer Neg Hx   :  Social History   Social History  . Marital Status: Divorced    Spouse Name: N/A  . Number of Children: N/A  . Years of Education: N/A   Occupational History  . Not on file.   Social History Main Topics  . Smoking status: Current Every Day Smoker -- 1.00 packs/day for 45 years    Types: Cigarettes  . Smokeless tobacco: Never Used  . Alcohol Use: 0.0 oz/week    0 Standard drinks or equivalent per week     Comment: "heavy" x  10 yrs- stopped 10 years ago  . Drug Use: No  . Sexual Activity: No   Other Topics Concern  . Not on file   Social History Narrative  :  Pertinent items are noted in HPI.  Exam: ECOG 0 There were no vitals taken for this visit. General appearance: alert and cooperative Head: Normocephalic, without obvious abnormality, atraumatic Throat: lips, mucosa, and tongue normal; teeth and gums normal Neck: no adenopathy, no carotid bruit, no JVD, supple, symmetrical, trachea midline and thyroid not enlarged, symmetric, no tenderness/mass/nodules Back: symmetric, no curvature. ROM normal. No CVA tenderness. Resp: clear to auscultation bilaterally Chest wall: no tenderness Cardio: regular rate and rhythm, S1, S2 normal, no murmur, click, rub or gallop GI: soft, non-tender; bowel sounds normal; no masses,  no organomegaly Extremities: extremities normal, atraumatic, no cyanosis or edema Pulses: 2+ and symmetric Skin: Skin color, texture, turgor normal. No rashes or lesions  CBC    Component Value Date/Time   WBC 11.8* 12/23/2014 1040   RBC 5.26 12/23/2014 1040   HGB 13.3 12/30/2014 0510   HCT 41.2 12/30/2014 0510   PLT 210 12/23/2014 1040   MCV 85.7 12/23/2014 1040   MCH 27.6 12/23/2014 1040   MCHC 32.2 12/23/2014 1040   RDW 14.7 12/23/2014 1040      Chemistry      Component Value Date/Time   NA 135 12/23/2014 1040   K 4.4 12/23/2014 1040   CL 104 12/23/2014 1040   CO2 24 12/23/2014 1040   BUN 14 12/23/2014 1040   CREATININE 1.01 12/23/2014 1040      Component Value Date/Time   CALCIUM 9.3 12/23/2014 1040       Assessment and Plan:    59 year old gentleman with the following issues:  1. Prostate cancer diagnosed in April 2016 with a PSA of 13.76 and a Gleason score of 4+3 = 7. He underwent a radical prostatectomy on 12/29/2014 with the pathology revealing T3bN1 disease with lymph node involvement and 1 out of 9 lymph nodes. He does not have any evidence of distant  metastasis. His postoperative PSA was up to 0.18.  His case was discussed today the prostate cancer multidisciplinary clinic. His imaging studies were reviewed by radiology. His pathology was also discussed with the reviewing pathologist. Given his disease involvement in the lymph glands as well as locally advanced nature of his disease he'll probably require multivitamin modality salvage therapy.  He would require salvage radiation therapy as well as hormone therapy in the form of [place] agonist or antagonist for at least extended period of time. The rationale of using these modalities were discussed with the patient based on ample data to support the use of adjuvant radiation therapy with hormone therapy. Also one can argue that he has locally advanced disease and the combination therapy have known to improve overall survival.  Complications associated with hormone therapy were discussed today. These would include hot  flashes, weight gain, osteoporosis, erectile dysfunction among others. I think he would be a reasonable candidate for that in the immediate future.  It is reasonable to start hormonal therapy first toe he achieves complete continence and radiation therapy would be used in the next few months.  The duration of hormonal therapy was also discussed and possibly this could be extended for 2-3 years depending on his tolerance. This can be discontinued later on if his PSA is close to 0.  The rationale of using chemotherapy was also reviewed and I think you data is rather weak in this particular area at this time and it would not be the standard of care at this time.  2. Back pain: Related to arthritis and no evidence of cancer. He takes Tylenol which seems to have helped his pain at this time.

## 2015-06-05 NOTE — Progress Notes (Signed)
IIEF-5. Patient completed question 1 and wrote n/a for the remaining questions

## 2015-06-05 NOTE — Progress Notes (Signed)
[place] Psychosocial Distress Screening Spiritual Care  Met with Carl Bowen at [facility] to introduce [facility] team/resources, reviewing distress screen per protocol.  The patient scored a 1 on the Psychosocial Distress Thermometer which indicates mild distress.  Also assessed for distress and other psychosocial needs.   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  Per pt, he is also on disability because of back issues (bulging disks, spinal stenosis, scoliosis, osteoporosis).  Perspective from past coping helps him cope with the current challenges:  "God has given me so many miracles, I feel like someone else must've been slighted!"  He cites housing as his top stressor because of the disrepair of his home and his desire to have his daughter "under the same roof" with him and his mom; per pt, that standard arrangement shifted after dtr's surgery because she needed to live in a mold-free environment.  He reports that urinary leaking has improved, which is relieving.    Provided pastoral presence, reflective listening, Support Center print materials, normalization of feelings, and emotional support.  Carl Bowen is aware of ongoing Support Team availability, but please also page as needs arise.  Thank you.  Follow up needed: No.   Suzette Battiest, [place] Pager [phone] Voicemail  [phone]

## 2015-06-05 NOTE — Progress Notes (Signed)
                               Care Plan Summary  Name: Carl Bowen DOB: Jan 28, 1956   Your Medical Team:   Urologist -  Dr. [name], [facility]  Radiation Oncologist - Dr. Arloa Koh, Tyler Pita, [facility]   Medical Oncologist - Dr. Zola Button, [place]  Recommendations: 1) Androgen Deprivation Therapy ( hormone injection) 2) Radiation Treatments   * These recommendations are based on information available as of today's consult.      Recommendations may change depending on the results of further tests or exams.  Next Steps: 1) Dr. Lynne Logan office will schedule your hormone injection  2) CT simulation August 07, 2015 at 11:00 am at [facility]   When appointments need to be scheduled, you will be contacted by [facility] and/or [facility].  Questions?  Please do not hesitate to call Cira Rue, RN, BSN, CRNI at [phone] any questions or concerns.  Shirlean Mylar is your Oncology Nurse Navigator and is available to assist you while you're receiving your medical care at [facility].

## 2015-06-05 NOTE — Progress Notes (Addendum)
Patient reports that he has places he believes are skin cancer but, he plans to have the "checked out." Patient disabled due to back issues. Divorced. 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 Reports leg cramps. Wears glasses. Reports ringing in his ears, sinus problems and wears dentures. Reports incontinence. Reports back pain and that he bruises easily.

## 2015-06-10 DIAGNOSIS — C61 Malignant neoplasm of prostate: Secondary | ICD-10-CM | POA: Diagnosis not present

## 2015-06-23 ENCOUNTER — Ambulatory Visit: Payer: Medicare Other | Admitting: Radiation Oncology

## 2015-07-01 DIAGNOSIS — C61 Malignant neoplasm of prostate: Secondary | ICD-10-CM | POA: Diagnosis not present

## 2015-07-01 DIAGNOSIS — M6281 Muscle weakness (generalized): Secondary | ICD-10-CM | POA: Diagnosis not present

## 2015-07-29 DIAGNOSIS — M6281 Muscle weakness (generalized): Secondary | ICD-10-CM | POA: Diagnosis not present

## 2015-07-29 DIAGNOSIS — C61 Malignant neoplasm of prostate: Secondary | ICD-10-CM | POA: Diagnosis not present

## 2015-08-07 ENCOUNTER — Ambulatory Visit
Admission: RE | Admit: 2015-08-07 | Discharge: 2015-08-07 | Disposition: A | Discharge status: Home / Self Care | Payer: Medicare Other | Source: Ambulatory Visit | Attending: Radiation Oncology | Admitting: Radiation Oncology

## 2015-09-02 DIAGNOSIS — C61 Malignant neoplasm of prostate: Secondary | ICD-10-CM | POA: Diagnosis not present

## 2015-09-02 DIAGNOSIS — M6281 Muscle weakness (generalized): Secondary | ICD-10-CM | POA: Diagnosis not present

## 2015-09-11 ENCOUNTER — Ambulatory Visit: Payer: Medicare Other | Admitting: Radiation Oncology

## 2015-10-09 ENCOUNTER — Ambulatory Visit
Admission: RE | Admit: 2015-10-09 | Discharge: 2015-10-09 | Disposition: A | Discharge status: Home / Self Care | Payer: Medicare Other | Source: Ambulatory Visit | Attending: Radiation Oncology | Admitting: Radiation Oncology

## 2015-10-09 DIAGNOSIS — C61 Malignant neoplasm of prostate: Secondary | ICD-10-CM

## 2015-10-09 NOTE — Progress Notes (Deleted)
  Radiation Oncology         [phone] ________________________________  Name: Carl Bowen MRN: [id number]  Date: 10/09/2015  DOB: 12/04/1955  SIMULATION AND TREATMENT PLANNING NOTE    ICD-9-CM ICD-10-CM   1. Prostate cancer ([place]) 27 C61     DIAGNOSIS:  60 y.o. gentleman with detectable rising PSA of 0.18 s/p prostatectomy with high risk pathology features  NARRATIVE:  The patient was brought to the [facility].  Identity was confirmed.  All relevant records and images related to the planned course of therapy were reviewed.  The patient freely provided informed written consent to proceed with treatment after reviewing the details related to the planned course of therapy. The consent form was witnessed and verified by the simulation staff.  Then, the patient was set-up in a stable reproducible supine position for radiation therapy.  A vacuum lock pillow device was custom fabricated to position his legs in a reproducible immobilized position.  Then, I performed a urethrogram under sterile conditions to identify the prostatic apex.  CT images were obtained.  Surface markings were placed.  The CT images were loaded into the planning software.  Then the prostate target and avoidance structures including the rectum, bladder, bowel and hips were contoured.  Treatment planning then occurred.  The radiation prescription was entered and confirmed.  A total of 1 complex treatment devices were fabricated. I have requested : Intensity Modulated Radiotherapy (IMRT) is medically necessary for this case for the following reason:  Rectal sparing.Marland Kitchen  PLAN:  The patient will receive 68.4 Gy in 38 fractions.  ________________________________  Sheral Apley Tammi Klippel, M.D.  This document serves as a record of services personally performed by Tyler Pita, MD. It was created on his behalf by Jenell Milliner, a trained medical scribe. The creation of this record is based on the scribe's personal  observations and the provider's statements to them. This document has been checked and approved by the attending provider.

## 2015-10-30 ENCOUNTER — Ambulatory Visit
Admission: RE | Admit: 2015-10-30 | Discharge: 2015-10-30 | Disposition: A | Discharge status: Home / Self Care | Payer: Medicare Other | Source: Ambulatory Visit | Attending: Radiation Oncology | Admitting: Radiation Oncology

## 2015-10-30 DIAGNOSIS — C61 Malignant neoplasm of prostate: Secondary | ICD-10-CM | POA: Insufficient documentation

## 2015-10-30 DIAGNOSIS — Z510 Encounter for antineoplastic radiation therapy: Secondary | ICD-10-CM | POA: Insufficient documentation

## 2015-10-30 NOTE — Progress Notes (Signed)
  Radiation Oncology         [phone] ________________________________  Name: Carl Bowen MRN: [id number]  Date: 10/30/2015  DOB: Sep 20, 1956  SIMULATION AND TREATMENT PLANNING NOTE    ICD-9-CM ICD-10-CM   1. Prostate cancer ([place]) 69 C61     DIAGNOSIS:  60 y.o. gentleman with detectable rising PSA of 0.18 s/p prostatectomy with high risk pathology features  NARRATIVE:  The patient was brought to the [facility].  Identity was confirmed.  All relevant records and images related to the planned course of therapy were reviewed.  The patient freely provided informed written consent to proceed with treatment after reviewing the details related to the planned course of therapy. The consent form was witnessed and verified by the simulation staff.  Then, the patient was set-up in a stable reproducible supine position for radiation therapy.  A vacuum lock pillow device was custom fabricated to position his legs in a reproducible immobilized position.  Then, I performed a urethrogram under sterile conditions to identify the prostatic apex.  CT images were obtained.  Surface markings were placed.  The CT images were loaded into the planning software.  Then the prostate target and avoidance structures including the rectum, bladder, bowel and hips were contoured.  Treatment planning then occurred.  The radiation prescription was entered and confirmed.  A total of one complex treatment devices were fabricated. I have requested : Intensity Modulated Radiotherapy (IMRT) is medically necessary for this case for the following reason:  Rectal sparing.Marland Kitchen  PLAN:  The patient will receive 68.4 Gy in 38 fractions.  ________________________________  Sheral Apley Tammi Klippel, M.D.  This document serves as a record of services personally performed by Tyler Pita, MD. It was created on his behalf by Arlyce Harman, a trained medical scribe. The creation of this record is based on the scribe's personal  observations and the provider's statements to them. This document has been checked and approved by the attending provider.

## 2015-11-06 DIAGNOSIS — C61 Malignant neoplasm of prostate: Secondary | ICD-10-CM | POA: Diagnosis not present

## 2015-11-06 DIAGNOSIS — Z510 Encounter for antineoplastic radiation therapy: Secondary | ICD-10-CM | POA: Diagnosis not present

## 2015-11-09 DIAGNOSIS — Z510 Encounter for antineoplastic radiation therapy: Secondary | ICD-10-CM | POA: Diagnosis not present

## 2015-11-09 DIAGNOSIS — C61 Malignant neoplasm of prostate: Secondary | ICD-10-CM | POA: Diagnosis not present

## 2015-11-10 ENCOUNTER — Ambulatory Visit
Admission: RE | Admit: 2015-11-10 | Discharge: 2015-11-10 | Disposition: A | Discharge status: Home / Self Care | Payer: Medicare Other | Source: Ambulatory Visit | Attending: Radiation Oncology | Admitting: Radiation Oncology

## 2015-11-10 ENCOUNTER — Encounter: Payer: Self-pay | Admitting: Medical Oncology

## 2015-11-10 DIAGNOSIS — C61 Malignant neoplasm of prostate: Secondary | ICD-10-CM | POA: Diagnosis not present

## 2015-11-10 NOTE — Progress Notes (Signed)
Oncology Nurse Navigator Documentation  Oncology Nurse Navigator Flowsheets 06/04/2015 06/05/2015 11/10/2015  Navigator Location - - [facility]  Navigator Encounter Type Telephone Clinic/MDC -  Abnormal Finding Date - - 03/13/2015- PSA post prostatectomy 12/29/14. Pathology report [id number].   Confirmed Diagnosis Date - - 06/05/2015- PSA continues to rise  Surgery Date - - 12/29/2014  Treatment Initiated Date - - 06/10/2015- ADT for 6 months prior to radiation  Patient Visit Type - Initial -  Treatment Phase - - First Radiation Tx  Barriers/Navigation Needs - No barriers at this time Education  Education - - Pain/ Symptom Management  Interventions - - Education Method  Education Method - - Verbal  Support Groups/Services - Friends and Family Friends and Family  Acuity - - Level 2  Acuity Level 2 - - Initial guidance, education and coordination as needed  Time Spent with Patient 30 30 30

## 2015-11-11 ENCOUNTER — Ambulatory Visit
Admission: RE | Admit: 2015-11-11 | Discharge: 2015-11-11 | Disposition: A | Discharge status: Home / Self Care | Payer: Medicare Other | Source: Ambulatory Visit | Attending: Radiation Oncology | Admitting: Radiation Oncology

## 2015-11-11 DIAGNOSIS — C61 Malignant neoplasm of prostate: Secondary | ICD-10-CM | POA: Diagnosis not present

## 2015-11-12 ENCOUNTER — Encounter: Payer: Self-pay | Admitting: Radiation Oncology

## 2015-11-12 ENCOUNTER — Ambulatory Visit
Admission: RE | Admit: 2015-11-12 | Discharge: 2015-11-12 | Disposition: A | Discharge status: Home / Self Care | Payer: Medicare Other | Source: Ambulatory Visit | Attending: Radiation Oncology | Admitting: Radiation Oncology

## 2015-11-12 DIAGNOSIS — C61 Malignant neoplasm of prostate: Secondary | ICD-10-CM | POA: Diagnosis not present

## 2015-11-13 ENCOUNTER — Encounter: Payer: Self-pay | Admitting: Radiation Oncology

## 2015-11-13 ENCOUNTER — Ambulatory Visit
Admission: RE | Admit: 2015-11-13 | Discharge: 2015-11-13 | Disposition: A | Discharge status: Home / Self Care | Payer: Medicare Other | Source: Ambulatory Visit | Attending: Radiation Oncology | Admitting: Radiation Oncology

## 2015-11-13 VITALS — BP 118/79 | HR 65 | Resp 16 | Wt 215.3 lb

## 2015-11-13 DIAGNOSIS — C61 Malignant neoplasm of prostate: Secondary | ICD-10-CM | POA: Insufficient documentation

## 2015-11-13 DIAGNOSIS — Z510 Encounter for antineoplastic radiation therapy: Secondary | ICD-10-CM | POA: Diagnosis not present

## 2015-11-13 DIAGNOSIS — N393 Stress incontinence (female) (male): Secondary | ICD-10-CM | POA: Insufficient documentation

## 2015-11-13 NOTE — Progress Notes (Signed)
  Radiation Oncology         [phone]   Name: Carl Bowen MRN: [id number]   Date: 11/13/2015  DOB: September 02, 1956   Weekly Radiation Therapy Management    ICD-9-CM ICD-10-CM   1. Prostate cancer ([place]) 185 C61     Current Dose: 7.2 Gy  Planned Dose:  68.4 Gy  Narrative The patient presents for routine under treatment assessment. Weight and vitals stable. Denies pain. Reports occasional stress incontinence only late in the day. Denies dysuria or hematuria. Reports fatigue since receiving androgen deprivation. Reports nocturia x 1-2. Denies diarrhea. Reports he cares for his 62 year old mother and daughter who had a heart transplant. Oriented patient to staff and routine of the clinic. Provided patient with RADIATION THERAPY AND YOU handbook then, reviewed pertinent information. Educated patient reference potential side effects and management such as fatigue, diarrhea, and urinary/bladder changes. Answered all patient questions to the best of my ability. Encouraged patient to contact his RN with future needs and provided him with my contact information. Patient verbalized understanding of all reviewed.  .  The patient is without complaint. Set-up films were reviewed. The chart was checked.  Physical Findings  weight is 215 lb 4.8 oz (97.659 kg). His blood pressure is 118/79 and his pulse is 65. His respiration is 16 and oxygen saturation is 100%. . Weight essentially stable.  No significant changes.  Impression The patient is tolerating radiation.  Plan Continue treatment as planned.      Sheral Apley Tammi Klippel, M.D.   This document serves as a record of services personally performed by Tyler Pita, MD. It was created on his behalf by Derek Mound, a trained medical scribe. The creation of this record is based on the scribe's personal observations and the provider's statements to them. This document has been checked and approved by the attending provider.

## 2015-11-13 NOTE — Progress Notes (Addendum)
Weight and vitals stable. Denies pain. Reports occasional stress incontinence only late in the day. Denies dysuria or hematuria. Reports fatigue since receiving androgen deprivation. Reports nocturia x 1-2. Denies diarrhea. Reports he cares for his 62 year old mother and daughter who had a heart transplant. Oriented patient to staff and routine of the clinic. Provided patient with RADIATION THERAPY AND YOU handbook then, reviewed pertinent information. Educated patient reference potential side effects and management such as fatigue, diarrhea, and urinary/bladder changes. Answered all patient questions to the best of my ability. Encouraged patient to contact his RN with future needs and provided him with my contact information. Patient verbalized understanding of all reviewed.   BP 118/79 mmHg  Pulse 65  Resp 16  Wt 215 lb 4.8 oz (97.659 kg)  SpO2 100% Wt Readings from Last 3 Encounters:  11/13/15 215 lb 4.8 oz (97.659 kg)  06/05/15 197 lb (89.359 kg)  12/29/14 190 lb (86.183 kg)

## 2015-11-16 ENCOUNTER — Ambulatory Visit
Admission: RE | Admit: 2015-11-16 | Discharge: 2015-11-16 | Disposition: A | Discharge status: Home / Self Care | Payer: Medicare Other | Source: Ambulatory Visit | Attending: Radiation Oncology | Admitting: Radiation Oncology

## 2015-11-16 DIAGNOSIS — C61 Malignant neoplasm of prostate: Secondary | ICD-10-CM | POA: Diagnosis not present

## 2015-11-16 DIAGNOSIS — Z510 Encounter for antineoplastic radiation therapy: Secondary | ICD-10-CM | POA: Diagnosis not present

## 2015-11-17 ENCOUNTER — Ambulatory Visit
Admission: RE | Admit: 2015-11-17 | Discharge: 2015-11-17 | Disposition: A | Discharge status: Home / Self Care | Payer: Medicare Other | Source: Ambulatory Visit | Attending: Radiation Oncology | Admitting: Radiation Oncology

## 2015-11-17 DIAGNOSIS — Z510 Encounter for antineoplastic radiation therapy: Secondary | ICD-10-CM | POA: Diagnosis not present

## 2015-11-17 DIAGNOSIS — C61 Malignant neoplasm of prostate: Secondary | ICD-10-CM | POA: Diagnosis not present

## 2015-11-18 ENCOUNTER — Ambulatory Visit
Admission: RE | Admit: 2015-11-18 | Discharge: 2015-11-18 | Disposition: A | Discharge status: Home / Self Care | Payer: Medicare Other | Source: Ambulatory Visit | Attending: Radiation Oncology | Admitting: Radiation Oncology

## 2015-11-18 DIAGNOSIS — Z510 Encounter for antineoplastic radiation therapy: Secondary | ICD-10-CM | POA: Diagnosis not present

## 2015-11-18 DIAGNOSIS — C61 Malignant neoplasm of prostate: Secondary | ICD-10-CM | POA: Diagnosis not present

## 2015-11-19 ENCOUNTER — Ambulatory Visit
Admission: RE | Admit: 2015-11-19 | Discharge: 2015-11-19 | Disposition: A | Discharge status: Home / Self Care | Payer: Medicare Other | Source: Ambulatory Visit | Attending: Radiation Oncology | Admitting: Radiation Oncology

## 2015-11-19 ENCOUNTER — Ambulatory Visit: Payer: Medicare Other

## 2015-11-20 ENCOUNTER — Ambulatory Visit
Admission: RE | Admit: 2015-11-20 | Discharge: 2015-11-20 | Disposition: A | Discharge status: Home / Self Care | Payer: Medicare Other | Source: Ambulatory Visit | Attending: Radiation Oncology | Admitting: Radiation Oncology

## 2015-11-20 VITALS — BP 112/78 | HR 67 | Resp 16 | Wt 212.9 lb

## 2015-11-20 DIAGNOSIS — Z510 Encounter for antineoplastic radiation therapy: Secondary | ICD-10-CM | POA: Diagnosis not present

## 2015-11-20 DIAGNOSIS — C61 Malignant neoplasm of prostate: Secondary | ICD-10-CM

## 2015-11-20 NOTE — Progress Notes (Signed)
Weight and vitals stable. Denies pain. Reports occasional stress incontinence only late in the day. Denies dysuria or hematuria. Reports nocturia x 2-3. Denies diarrhea. Reports fatigue since receiving androgen deprivation.    BP 112/78 mmHg  Pulse 67  Resp 16  Wt 212 lb 14.4 oz (96.571 kg)  SpO2 100% Wt Readings from Last 3 Encounters:  11/20/15 212 lb 14.4 oz (96.571 kg)  11/13/15 215 lb 4.8 oz (97.659 kg)  06/05/15 197 lb (89.359 kg)

## 2015-11-20 NOTE — Progress Notes (Signed)
  Radiation Oncology         [phone]   Name: Carl Bowen MRN: [id number]   Date: 11/20/2015  DOB: [birth year]     Weekly Radiation Therapy Management    ICD-9-CM ICD-10-CM   1. Prostate cancer ([place]) 185 C61     Current Dose: 14.4 Gy Planned Dose:  68.4 Gy  Narrative The patient presents for routine under treatment assessment.  Treatment was not delivered yesterday due to machine dysfunction. He states he is doing well overall. He denies any significant changes in his urinary function. He continues to have episodes of incontinence later in the day, and to 3 episodes of nocturia. He denies dysuria, hematuria, urgency or hesitancy. No other complaints or verbalized.  Set-up films were reviewed. The chart was checked.  Physical Findings  weight is 212 lb 14.4 oz (96.571 kg). His blood pressure is 112/78 and his pulse is 67. His respiration is 16 and oxygen saturation is 100%.   Pain scale 0/10 In general this is a well appearing Caucasian male. He is alert and oriented x4 and appropriate throughout the examination. Cardiopulmonary evaluation is negative for acute distress. He exhibits normal effort. Weight essentially stable.  No significant changes.  Impression The patient is tolerating radiation.  Plan We will proceed with continued radiotherapy treatment and follow up with him next week.      Sheral Apley Tammi Klippel, M.D.   This document serves as a record of services personally performed by Tyler Pita, MD. It was created on his behalf by Arlyce Harman, a trained medical scribe. The creation of this record is based on the scribe's personal observations and the provider's statements to them. This document has been checked and approved by the attending provider.

## 2015-11-23 ENCOUNTER — Ambulatory Visit
Admission: RE | Admit: 2015-11-23 | Discharge: 2015-11-23 | Disposition: A | Discharge status: Home / Self Care | Payer: Medicare Other | Source: Ambulatory Visit | Attending: Radiation Oncology | Admitting: Radiation Oncology

## 2015-11-23 ENCOUNTER — Encounter: Payer: Self-pay | Admitting: Medical Oncology

## 2015-11-23 DIAGNOSIS — Z510 Encounter for antineoplastic radiation therapy: Secondary | ICD-10-CM | POA: Diagnosis not present

## 2015-11-23 DIAGNOSIS — C61 Malignant neoplasm of prostate: Secondary | ICD-10-CM | POA: Diagnosis not present

## 2015-11-24 ENCOUNTER — Ambulatory Visit
Admission: RE | Admit: 2015-11-24 | Discharge: 2015-11-24 | Disposition: A | Discharge status: Home / Self Care | Payer: Medicare Other | Source: Ambulatory Visit | Attending: Radiation Oncology | Admitting: Radiation Oncology

## 2015-11-24 DIAGNOSIS — C61 Malignant neoplasm of prostate: Secondary | ICD-10-CM | POA: Diagnosis not present

## 2015-11-24 DIAGNOSIS — Z510 Encounter for antineoplastic radiation therapy: Secondary | ICD-10-CM | POA: Diagnosis not present

## 2015-11-25 ENCOUNTER — Ambulatory Visit
Admission: RE | Admit: 2015-11-25 | Discharge: 2015-11-25 | Disposition: A | Discharge status: Home / Self Care | Payer: Medicare Other | Source: Ambulatory Visit | Attending: Radiation Oncology | Admitting: Radiation Oncology

## 2015-11-25 DIAGNOSIS — C61 Malignant neoplasm of prostate: Secondary | ICD-10-CM | POA: Diagnosis not present

## 2015-11-25 DIAGNOSIS — Z510 Encounter for antineoplastic radiation therapy: Secondary | ICD-10-CM | POA: Diagnosis not present

## 2015-11-26 ENCOUNTER — Ambulatory Visit
Admission: RE | Admit: 2015-11-26 | Discharge: 2015-11-26 | Disposition: A | Discharge status: Home / Self Care | Payer: Medicare Other | Source: Ambulatory Visit | Attending: Radiation Oncology | Admitting: Radiation Oncology

## 2015-11-26 ENCOUNTER — Ambulatory Visit: Admission: RE | Admit: 2015-11-26 | Payer: Medicare Other | Source: Ambulatory Visit

## 2015-11-27 ENCOUNTER — Ambulatory Visit
Admission: RE | Admit: 2015-11-27 | Discharge: 2015-11-27 | Disposition: A | Discharge status: Home / Self Care | Payer: Medicare Other | Source: Ambulatory Visit | Attending: Radiation Oncology | Admitting: Radiation Oncology

## 2015-11-27 ENCOUNTER — Encounter: Payer: Self-pay | Admitting: Radiation Oncology

## 2015-11-27 VITALS — BP 120/82 | HR 58 | Resp 16 | Wt 211.5 lb

## 2015-11-27 DIAGNOSIS — C61 Malignant neoplasm of prostate: Secondary | ICD-10-CM

## 2015-11-27 DIAGNOSIS — Z510 Encounter for antineoplastic radiation therapy: Secondary | ICD-10-CM | POA: Diagnosis not present

## 2015-11-27 NOTE — Progress Notes (Signed)
  Radiation Oncology         [phone]   Name: Carl Bowen MRN: [id number]   Date: 11/27/2015  DOB: 1956/08/22     Weekly Radiation Therapy Management    ICD-9-CM ICD-10-CM   1. Prostate cancer ([place]) 185 C61     Current Dose: 21.6 Gy Planned Dose:  68.4 Gy  Narrative The patient presents for routine under treatment assessment.  Weight and vitals stable. Denies pain. Reports occasional stress incontinence only late in the day. Denies dysuria or hematuria. Reports nocturia x 2-3. Denies diarrhea. Reports fatigue since receiving androgen deprivation.  Set-up films were reviewed. The chart was checked.  Physical Findings  weight is 211 lb 8 oz (95.936 kg). His blood pressure is 120/82 and his pulse is 58. His respiration is 16 and oxygen saturation is 100%.   In general this is a well appearing Caucasian male. He is alert and oriented x4 and appropriate throughout the examination. Weight essentially stable.  No significant changes.  Impression The patient is tolerating radiation.  Plan We will proceed with continued radiotherapy treatment and follow up with him next week.      Sheral Apley Tammi Klippel, M.D.   This document serves as a record of services personally performed by Tyler Pita, MD. It was created on his behalf by Lendon Collar, a trained medical scribe. The creation of this record is based on the scribe's personal observations and the provider's statements to them. This document has been checked and approved by the attending provider.

## 2015-11-27 NOTE — Progress Notes (Signed)
Weight and vitals stable. Denies pain. Reports occasional stress incontinence only late in the day. Denies dysuria or hematuria. Reports nocturia x 2-3. Denies diarrhea. Reports fatigue since receiving androgen deprivation.  BP 120/82 mmHg  Pulse 58  Resp 16  Wt 211 lb 8 oz (95.936 kg)  SpO2 100% Wt Readings from Last 3 Encounters:  11/27/15 211 lb 8 oz (95.936 kg)  11/20/15 212 lb 14.4 oz (96.571 kg)  11/13/15 215 lb 4.8 oz (97.659 kg)

## 2015-11-30 ENCOUNTER — Ambulatory Visit
Admission: RE | Admit: 2015-11-30 | Discharge: 2015-11-30 | Disposition: A | Discharge status: Home / Self Care | Payer: Medicare Other | Source: Ambulatory Visit | Attending: Radiation Oncology | Admitting: Radiation Oncology

## 2015-11-30 DIAGNOSIS — C61 Malignant neoplasm of prostate: Secondary | ICD-10-CM | POA: Diagnosis not present

## 2015-11-30 DIAGNOSIS — Z510 Encounter for antineoplastic radiation therapy: Secondary | ICD-10-CM | POA: Diagnosis not present

## 2015-12-01 ENCOUNTER — Ambulatory Visit
Admission: RE | Admit: 2015-12-01 | Discharge: 2015-12-01 | Disposition: A | Discharge status: Home / Self Care | Payer: Medicare Other | Source: Ambulatory Visit | Attending: Radiation Oncology | Admitting: Radiation Oncology

## 2015-12-01 ENCOUNTER — Telehealth: Payer: Self-pay | Admitting: Radiation Oncology

## 2015-12-01 DIAGNOSIS — Z510 Encounter for antineoplastic radiation therapy: Secondary | ICD-10-CM | POA: Diagnosis not present

## 2015-12-01 DIAGNOSIS — C61 Malignant neoplasm of prostate: Secondary | ICD-10-CM | POA: Diagnosis not present

## 2015-12-01 NOTE — Telephone Encounter (Signed)
Patient left message requesting return call. Phoned patient. No answer. Left message.

## 2015-12-01 NOTE — Telephone Encounter (Signed)
Patient phoned reports left hip pain. Reports pain is worse when he attempts to rise from a laying or sitting position. Reports pain is a lot better this morning than it was late yesterday. Reports taking extra strength Tylenol. As discussion progress patient explains he lifted several things yesterday "and probably over did it" trying to help his friend fix his car. Patient is on disability due to old back injury. Encouraged patient to alternate tylenol and motrin to manage pain. Offered for patient to be evaluated by Shona Simpson, PA-C today. Patient states, "I may come around and see her after treatment if the pain doesn't continue to improve." Patient understands to contact this RN with future needs. Patient verbalizes appreciation for the return call.

## 2015-12-02 ENCOUNTER — Ambulatory Visit
Admission: RE | Admit: 2015-12-02 | Discharge: 2015-12-02 | Disposition: A | Discharge status: Home / Self Care | Payer: Medicare Other | Source: Ambulatory Visit | Attending: Radiation Oncology | Admitting: Radiation Oncology

## 2015-12-02 DIAGNOSIS — Z510 Encounter for antineoplastic radiation therapy: Secondary | ICD-10-CM | POA: Diagnosis not present

## 2015-12-02 DIAGNOSIS — C61 Malignant neoplasm of prostate: Secondary | ICD-10-CM | POA: Diagnosis not present

## 2015-12-03 ENCOUNTER — Ambulatory Visit
Admission: RE | Admit: 2015-12-03 | Discharge: 2015-12-03 | Disposition: A | Discharge status: Home / Self Care | Payer: Medicare Other | Source: Ambulatory Visit | Attending: Radiation Oncology | Admitting: Radiation Oncology

## 2015-12-03 DIAGNOSIS — Z510 Encounter for antineoplastic radiation therapy: Secondary | ICD-10-CM | POA: Diagnosis not present

## 2015-12-03 DIAGNOSIS — C61 Malignant neoplasm of prostate: Secondary | ICD-10-CM | POA: Diagnosis not present

## 2015-12-04 ENCOUNTER — Ambulatory Visit
Admission: RE | Admit: 2015-12-04 | Discharge: 2015-12-04 | Disposition: A | Discharge status: Home / Self Care | Payer: Medicare Other | Source: Ambulatory Visit | Attending: Radiation Oncology | Admitting: Radiation Oncology

## 2015-12-04 VITALS — BP 124/84 | HR 60 | Resp 16 | Wt 213.1 lb

## 2015-12-04 DIAGNOSIS — Z510 Encounter for antineoplastic radiation therapy: Secondary | ICD-10-CM | POA: Diagnosis not present

## 2015-12-04 DIAGNOSIS — C61 Malignant neoplasm of prostate: Secondary | ICD-10-CM | POA: Diagnosis not present

## 2015-12-04 NOTE — Progress Notes (Signed)
  Radiation Oncology         [phone]   Name: Carl Bowen MRN: [id number]   Date: 12/04/2015  DOB: 1956-04-17     Weekly Radiation Therapy Management    ICD-9-CM ICD-10-CM   1. Prostate cancer ([place]) 185 C61     Current Dose: 30.6 Gy Planned Dose:  68.4 Gy  Narrative The patient presents for routine under treatment assessment.  Weight and vitals stable. Denies pain. Reports occasional stress incontinence only late in the day. Denies dysuria or hematuria. Reports nocturia x 2-3. Denies diarrhea. Reports fatigue since receiving androgen deprivation. Labs work scheduled with Carl Bowen on the 15th and follow up with on 22nd.  Set-up films were reviewed. The chart was checked.  Physical Findings  weight is 213 lb 1.6 oz (96.662 kg). His blood pressure is 124/84 and his pulse is 60. His respiration is 16 and oxygen saturation is 100%.   In general this is a well appearing Caucasian male. He is alert and oriented x4 and appropriate throughout the examination. Weight essentially stable.  No significant changes.  Impression The patient is tolerating radiation.  Plan We will proceed with continued radiotherapy treatment and follow up with him next week.      Sheral Apley Tammi Klippel, M.D.   This document serves as a record of services personally performed by Tyler Pita, MD. It was created on his behalf by Arlyce Harman, a trained medical scribe. The creation of this record is based on the scribe's personal observations and the provider's statements to them. This document has been checked and approved by the attending provider.

## 2015-12-04 NOTE — Progress Notes (Signed)
Weight and vitals stable. Denies pain. Reports occasional stress incontinence only late in the day. Denies dysuria or hematuria. Reports nocturia x 2-3. Denies diarrhea. Reports fatigue since receiving androgen deprivation. Labs work scheduled with Alinda Money on the 15th and follow up with on 22nd.  BP 124/84 mmHg  Pulse 60  Resp 16  Wt 213 lb 1.6 oz (96.662 kg)  SpO2 100% Wt Readings from Last 3 Encounters:  12/04/15 213 lb 1.6 oz (96.662 kg)  11/27/15 211 lb 8 oz (95.936 kg)  11/20/15 212 lb 14.4 oz (96.571 kg)

## 2015-12-07 ENCOUNTER — Ambulatory Visit
Admission: RE | Admit: 2015-12-07 | Discharge: 2015-12-07 | Disposition: A | Discharge status: Home / Self Care | Payer: Medicare Other | Source: Ambulatory Visit | Attending: Radiation Oncology | Admitting: Radiation Oncology

## 2015-12-07 DIAGNOSIS — Z510 Encounter for antineoplastic radiation therapy: Secondary | ICD-10-CM | POA: Diagnosis not present

## 2015-12-07 DIAGNOSIS — C61 Malignant neoplasm of prostate: Secondary | ICD-10-CM | POA: Diagnosis not present

## 2015-12-08 ENCOUNTER — Ambulatory Visit
Admission: RE | Admit: 2015-12-08 | Discharge: 2015-12-08 | Disposition: A | Discharge status: Home / Self Care | Payer: Medicare Other | Source: Ambulatory Visit | Attending: Radiation Oncology | Admitting: Radiation Oncology

## 2015-12-08 DIAGNOSIS — C61 Malignant neoplasm of prostate: Secondary | ICD-10-CM | POA: Diagnosis not present

## 2015-12-08 DIAGNOSIS — Z510 Encounter for antineoplastic radiation therapy: Secondary | ICD-10-CM | POA: Diagnosis not present

## 2015-12-09 ENCOUNTER — Ambulatory Visit
Admission: RE | Admit: 2015-12-09 | Discharge: 2015-12-09 | Disposition: A | Discharge status: Home / Self Care | Payer: Medicare Other | Source: Ambulatory Visit | Attending: Radiation Oncology | Admitting: Radiation Oncology

## 2015-12-09 DIAGNOSIS — Z510 Encounter for antineoplastic radiation therapy: Secondary | ICD-10-CM | POA: Diagnosis not present

## 2015-12-09 DIAGNOSIS — C61 Malignant neoplasm of prostate: Secondary | ICD-10-CM | POA: Diagnosis not present

## 2015-12-10 ENCOUNTER — Ambulatory Visit
Admission: RE | Admit: 2015-12-10 | Discharge: 2015-12-10 | Disposition: A | Discharge status: Home / Self Care | Payer: Medicare Other | Source: Ambulatory Visit | Attending: Radiation Oncology | Admitting: Radiation Oncology

## 2015-12-10 DIAGNOSIS — C61 Malignant neoplasm of prostate: Secondary | ICD-10-CM | POA: Diagnosis not present

## 2015-12-10 DIAGNOSIS — Z510 Encounter for antineoplastic radiation therapy: Secondary | ICD-10-CM | POA: Diagnosis not present

## 2015-12-11 ENCOUNTER — Ambulatory Visit
Admission: RE | Admit: 2015-12-11 | Discharge: 2015-12-11 | Disposition: A | Discharge status: Home / Self Care | Payer: Medicare Other | Source: Ambulatory Visit | Attending: Radiation Oncology | Admitting: Radiation Oncology

## 2015-12-11 ENCOUNTER — Encounter: Payer: Self-pay | Admitting: Radiation Oncology

## 2015-12-11 VITALS — BP 118/80 | HR 60 | Resp 16 | Wt 213.2 lb

## 2015-12-11 DIAGNOSIS — C61 Malignant neoplasm of prostate: Secondary | ICD-10-CM

## 2015-12-11 DIAGNOSIS — Z510 Encounter for antineoplastic radiation therapy: Secondary | ICD-10-CM | POA: Diagnosis not present

## 2015-12-11 NOTE — Progress Notes (Addendum)
Weight and vitals stable. Denies pain. Reports occasional stress incontinence late in the day only. Denies hematuria. Managing dysuria with motrin. Reports nocturia x 4-5. Denies diarrhea. Reports fatigue.   BP 118/80 mmHg  Pulse 60  Resp 16  Wt 213 lb 3.2 oz (96.707 kg)  SpO2 100% Wt Readings from Last 3 Encounters:  12/11/15 213 lb 3.2 oz (96.707 kg)  12/04/15 213 lb 1.6 oz (96.662 kg)  11/27/15 211 lb 8 oz (95.936 kg)

## 2015-12-11 NOTE — Progress Notes (Signed)
  Radiation Oncology         [phone]   Name: Carl Bowen MRN: [id number]   Date: 12/11/2015  DOB: Feb 18, 1956     Weekly Radiation Therapy Management    ICD-9-CM ICD-10-CM   1. Prostate cancer ([place]) 185 C61     Current Dose: 39.6 Gy Planned Dose:  68.4 Gy  Narrative The patient presents for routine under treatment assessment.  Weight and vitals stable. Denies pain. Reports occasional stress incontinence late in the day only. Denies dysuria or hematuria. Reports nocturia x 2-3. Denies diarrhea. Reports fatigue.  Set-up films were reviewed. The chart was checked.  Physical Findings  weight is 213 lb 3.2 oz (96.707 kg). His blood pressure is 118/80 and his pulse is 60. His respiration is 16 and oxygen saturation is 100%.   In general this is a well appearing Caucasian male. He is alert and oriented x4 and appropriate throughout the examination. Weight essentially stable.  No significant changes.  Impression The patient is tolerating radiation.  Plan We will proceed with continued radiotherapy treatment and follow up with him 12/21/2015.      Sheral Apley Tammi Klippel, M.D.    This document serves as a record of services personally performed by Tyler Pita, MD. It was created on his behalf by Lendon Collar, a trained medical scribe. The creation of this record is based on the scribe's personal observations and the provider's statements to them. This document has been checked and approved by the attending provider.

## 2015-12-14 ENCOUNTER — Ambulatory Visit
Admission: RE | Admit: 2015-12-14 | Discharge: 2015-12-14 | Disposition: A | Discharge status: Home / Self Care | Payer: Medicare Other | Source: Ambulatory Visit | Attending: Radiation Oncology | Admitting: Radiation Oncology

## 2015-12-14 DIAGNOSIS — Z510 Encounter for antineoplastic radiation therapy: Secondary | ICD-10-CM | POA: Diagnosis not present

## 2015-12-14 DIAGNOSIS — C61 Malignant neoplasm of prostate: Secondary | ICD-10-CM | POA: Diagnosis not present

## 2015-12-15 ENCOUNTER — Ambulatory Visit
Admission: RE | Admit: 2015-12-15 | Discharge: 2015-12-15 | Disposition: A | Discharge status: Home / Self Care | Payer: Medicare Other | Source: Ambulatory Visit | Attending: Radiation Oncology | Admitting: Radiation Oncology

## 2015-12-15 DIAGNOSIS — C61 Malignant neoplasm of prostate: Secondary | ICD-10-CM | POA: Diagnosis not present

## 2015-12-15 DIAGNOSIS — Z510 Encounter for antineoplastic radiation therapy: Secondary | ICD-10-CM | POA: Diagnosis not present

## 2015-12-16 ENCOUNTER — Ambulatory Visit
Admission: RE | Admit: 2015-12-16 | Discharge: 2015-12-16 | Disposition: A | Discharge status: Home / Self Care | Payer: Medicare Other | Source: Ambulatory Visit | Attending: Radiation Oncology | Admitting: Radiation Oncology

## 2015-12-16 DIAGNOSIS — C61 Malignant neoplasm of prostate: Secondary | ICD-10-CM | POA: Diagnosis not present

## 2015-12-16 DIAGNOSIS — Z510 Encounter for antineoplastic radiation therapy: Secondary | ICD-10-CM | POA: Diagnosis not present

## 2015-12-16 DIAGNOSIS — Z0000 Encounter for general adult medical examination without abnormal findings: Secondary | ICD-10-CM | POA: Diagnosis not present

## 2015-12-16 DIAGNOSIS — N393 Stress incontinence (female) (male): Secondary | ICD-10-CM | POA: Diagnosis not present

## 2015-12-17 ENCOUNTER — Ambulatory Visit
Admission: RE | Admit: 2015-12-17 | Discharge: 2015-12-17 | Disposition: A | Discharge status: Home / Self Care | Payer: Medicare Other | Source: Ambulatory Visit | Attending: Radiation Oncology | Admitting: Radiation Oncology

## 2015-12-17 DIAGNOSIS — Z510 Encounter for antineoplastic radiation therapy: Secondary | ICD-10-CM | POA: Diagnosis not present

## 2015-12-17 DIAGNOSIS — C61 Malignant neoplasm of prostate: Secondary | ICD-10-CM | POA: Diagnosis not present

## 2015-12-18 ENCOUNTER — Encounter: Payer: Self-pay | Admitting: Radiation Oncology

## 2015-12-18 ENCOUNTER — Ambulatory Visit
Admission: RE | Admit: 2015-12-18 | Discharge: 2015-12-18 | Disposition: A | Discharge status: Home / Self Care | Payer: Medicare Other | Source: Ambulatory Visit | Attending: Radiation Oncology | Admitting: Radiation Oncology

## 2015-12-18 VITALS — BP 115/77 | HR 58 | Temp 98.0°F | Ht 71.0 in | Wt 213.0 lb

## 2015-12-18 DIAGNOSIS — C61 Malignant neoplasm of prostate: Secondary | ICD-10-CM

## 2015-12-18 DIAGNOSIS — Z510 Encounter for antineoplastic radiation therapy: Secondary | ICD-10-CM | POA: Diagnosis not present

## 2015-12-18 NOTE — Progress Notes (Signed)
Carl Bowen has completed 27 fractions to his prostate.  He reports having dysuria that he manages with ibuprofen or tylenol.  He denies having hematuria.  He reports an increase in urinary frequency and is sometimes urinating small amounts.  He reports nocturia every hour/hour and a half.  He reports having occasional constipation.  He is last bm was this morning.  He reports having fatigue.  He had a Lupron injection on Wednesday.  BP 115/77 mmHg  Pulse 58  Temp(Src) 98 F (36.7 C) (Oral)  Ht 5\' 11"  (1.803 m)  Wt 213 lb (96.616 kg)  BMI 29.72 kg/m2

## 2015-12-18 NOTE — Progress Notes (Signed)
  Radiation Oncology         [phone]   Name: Carl Bowen MRN: [id number]   Date: 12/18/2015  DOB: February 25, 1956     Weekly Radiation Therapy Management    ICD-9-CM ICD-10-CM   1. Prostate cancer ([place]) 185 C61     Current Dose: 48.6 Gy Planned Dose:  68.4 Gy  Narrative The patient presents for routine under treatment assessment.  Carl Bowen has completed 27 fractions to his prostate. He reports having dysuria that he manages with ibuprofen or tylenol. This dysuria began over one week ago. He denies having hematuria. He reports an increase in urinary frequency and is sometimes urinating small amounts. He reports nocturia every hour/hour and a half. Declines medication to manage this nocturia. He reports having occasional constipation. He is last bm was this morning. He reports having fatigue. He had a Lupron injection on Wednesday. Denies any fever or chills.  He would rather avoid medications for symptom management.  Set-up films were reviewed. The chart was checked.  Physical Findings  height is 5\' 11"  (1.803 m) and weight is 213 lb (96.616 kg). His oral temperature is 98 F (36.7 C). His blood pressure is 115/77 and his pulse is 58.   In general this is a well appearing Caucasian male. He is alert and oriented x4 and appropriate throughout the examination. Weight essentially stable.  No significant changes.  Impression The patient is tolerating radiation with expected side effects.  He would rather avoid medications for symptom management..  Plan We will proceed with continued radiotherapy treatment. The patient declines medication to manage current nocturia. He will contact us if he decides he would like a prescription.     -----------------------------------  Eppie Gibson, MD    This document serves as a record of services personally performed by Eppie Gibson, MD. It was created on her behalf by Arlyce Harman, a trained medical scribe. The creation of this record is  based on the scribe's personal observations and the provider's statements to them. This document has been checked and approved by the attending provider.

## 2015-12-21 ENCOUNTER — Ambulatory Visit: Payer: Medicare Other

## 2015-12-21 ENCOUNTER — Encounter: Payer: Self-pay | Admitting: Radiation Oncology

## 2015-12-21 ENCOUNTER — Ambulatory Visit
Admission: RE | Admit: 2015-12-21 | Discharge: 2015-12-21 | Disposition: A | Discharge status: Home / Self Care | Payer: Medicare Other | Source: Ambulatory Visit | Attending: Radiation Oncology | Admitting: Radiation Oncology

## 2015-12-22 ENCOUNTER — Ambulatory Visit
Admission: RE | Admit: 2015-12-22 | Discharge: 2015-12-22 | Disposition: A | Discharge status: Home / Self Care | Payer: Medicare Other | Source: Ambulatory Visit | Attending: Radiation Oncology | Admitting: Radiation Oncology

## 2015-12-22 DIAGNOSIS — Z510 Encounter for antineoplastic radiation therapy: Secondary | ICD-10-CM | POA: Diagnosis not present

## 2015-12-22 DIAGNOSIS — C61 Malignant neoplasm of prostate: Secondary | ICD-10-CM | POA: Diagnosis not present

## 2015-12-23 ENCOUNTER — Ambulatory Visit
Admission: RE | Admit: 2015-12-23 | Discharge: 2015-12-23 | Disposition: A | Discharge status: Home / Self Care | Payer: Medicare Other | Source: Ambulatory Visit | Attending: Radiation Oncology | Admitting: Radiation Oncology

## 2015-12-23 DIAGNOSIS — C61 Malignant neoplasm of prostate: Secondary | ICD-10-CM | POA: Diagnosis not present

## 2015-12-23 DIAGNOSIS — Z510 Encounter for antineoplastic radiation therapy: Secondary | ICD-10-CM | POA: Diagnosis not present

## 2015-12-24 ENCOUNTER — Ambulatory Visit
Admission: RE | Admit: 2015-12-24 | Discharge: 2015-12-24 | Disposition: A | Discharge status: Home / Self Care | Payer: Medicare Other | Source: Ambulatory Visit | Attending: Radiation Oncology | Admitting: Radiation Oncology

## 2015-12-24 ENCOUNTER — Encounter: Payer: Self-pay | Admitting: Medical Oncology

## 2015-12-24 DIAGNOSIS — C61 Malignant neoplasm of prostate: Secondary | ICD-10-CM | POA: Diagnosis not present

## 2015-12-24 DIAGNOSIS — Z510 Encounter for antineoplastic radiation therapy: Secondary | ICD-10-CM | POA: Diagnosis not present

## 2015-12-24 NOTE — Progress Notes (Unsigned)
Oncology Nurse Navigator Documentation  Oncology Nurse Navigator Flowsheets 11/10/2015 11/23/2015 12/24/2015  Navigator Location [facility] - -  Navigator Encounter Type - Treatment Treatment- Carl Bowen states he is doing well with his radiation treatments. He does have dysuria and frequency. He takes ibuprofen if needed. He does feel fatigued but he is able to do his normal activity. We talked about how the Lupron and radiation both cause fatigue. Once her completes the radiation hopefully he will begin to recover. He had his 2nd Lupron injection last week. I asked him to call me with questions and or concerns. He voiced understanding.  Abnormal Finding Date 03/13/2015 - -  Confirmed Diagnosis Date 06/12/2015 - -  Surgery Date 12/29/2014 - -  Treatment Initiated Date 06/10/2015 - -  Patient Visit Type - - -  Treatment Phase First Radiation Tx Treatment Treatment  Barriers/Navigation Needs Education No barriers at this time No barriers at this time  Education Pain/ Symptom Management - -  Interventions Education Method - -  Education Method Verbal - -  Support Groups/Services Friends and Family Friends and Family Friends and Family  Acuity Level 2 - Level 1  Acuity Level 1 - - Minimal follow up required  Acuity Level 2 Initial guidance, education and coordination as needed - -  Time Spent with Patient 30 15 30

## 2015-12-25 ENCOUNTER — Ambulatory Visit
Admission: RE | Admit: 2015-12-25 | Discharge: 2015-12-25 | Disposition: A | Discharge status: Home / Self Care | Payer: Medicare Other | Source: Ambulatory Visit | Attending: Radiation Oncology | Admitting: Radiation Oncology

## 2015-12-25 ENCOUNTER — Encounter: Payer: Self-pay | Admitting: Radiation Oncology

## 2015-12-25 VITALS — BP 113/80 | HR 65 | Temp 98.1°F | Resp 16 | Wt 211.6 lb

## 2015-12-25 DIAGNOSIS — C61 Malignant neoplasm of prostate: Secondary | ICD-10-CM

## 2015-12-25 DIAGNOSIS — Z510 Encounter for antineoplastic radiation therapy: Secondary | ICD-10-CM | POA: Diagnosis not present

## 2015-12-25 NOTE — Progress Notes (Signed)
Weight and vitals stable. Afebrile. Denies pain. Reports new onset of nasal congestion. Reports manageable dysuria. Denies hematuria. Reports urinary frequency. Denies incontinence but, reports occasional leakage. Reports nocturia every 1-1.5 hours. Reports fatigue and sluggishness related to effects of Lupron. Received second Lupron injection last Wednesday.   BP 113/80 mmHg  Pulse 65  Temp(Src) 98.1 F (36.7 C) (Oral)  Resp 16  Wt 211 lb 9.6 oz (95.981 kg)  SpO2 100% Wt Readings from Last 3 Encounters:  12/25/15 211 lb 9.6 oz (95.981 kg)  12/18/15 213 lb (96.616 kg)  12/11/15 213 lb 3.2 oz (96.707 kg)

## 2015-12-25 NOTE — Progress Notes (Signed)
  Radiation Oncology         [phone]   Name: Carl Bowen MRN: [id number]   Date: 12/25/2015  DOB: [birth year]     Weekly Radiation Therapy Management    ICD-9-CM ICD-10-CM   1. Prostate cancer ([place]) 185 C61     Current Dose: 55.8 Gy Planned Dose:  68.4 Gy  Narrative The patient presents for routine under treatment assessment.  Weight and vitals stable. Afebrile. Denies pain. Reports new onset of nasal congestion. Reports manageable dysuria. Denies hematuria. Reports urinary frequency. Denies incontinence but, reports occasional leakage. Reports nocturia every 1-1.5 hours. Reports fatigue and sluggishness related to effects of Lupron. Received second Lupron injection last Wednesday.   Set-up films were reviewed. The chart was checked.  Physical Findings  weight is 211 lb 9.6 oz (95.981 kg). His oral temperature is 98.1 F (36.7 C). His blood pressure is 113/80 and his pulse is 65. His respiration is 16 and oxygen saturation is 100%.   In general this is a well appearing Caucasian male. He is alert and oriented x4 and appropriate throughout the examination. Weight essentially stable.  No significant changes.  Impression The patient is tolerating radiation.  Plan We will proceed with continued radiotherapy treatment.      Sheral Apley Tammi Klippel, M.D.    This document serves as a record of services personally performed by Tyler Pita, MD. It was created on his behalf by Arlyce Harman, a trained medical scribe. The creation of this record is based on the scribe's personal observations and the provider's statements to them. This document has been checked and approved by the attending provider.

## 2015-12-28 ENCOUNTER — Ambulatory Visit
Admission: RE | Admit: 2015-12-28 | Discharge: 2015-12-28 | Disposition: A | Discharge status: Home / Self Care | Payer: Medicare Other | Source: Ambulatory Visit | Attending: Radiation Oncology | Admitting: Radiation Oncology

## 2015-12-28 DIAGNOSIS — C61 Malignant neoplasm of prostate: Secondary | ICD-10-CM | POA: Diagnosis not present

## 2015-12-28 DIAGNOSIS — Z510 Encounter for antineoplastic radiation therapy: Secondary | ICD-10-CM | POA: Diagnosis not present

## 2015-12-29 ENCOUNTER — Ambulatory Visit
Admission: RE | Admit: 2015-12-29 | Discharge: 2015-12-29 | Disposition: A | Discharge status: Home / Self Care | Payer: Medicare Other | Source: Ambulatory Visit | Attending: Radiation Oncology | Admitting: Radiation Oncology

## 2015-12-29 DIAGNOSIS — Z510 Encounter for antineoplastic radiation therapy: Secondary | ICD-10-CM | POA: Diagnosis not present

## 2015-12-29 DIAGNOSIS — C61 Malignant neoplasm of prostate: Secondary | ICD-10-CM | POA: Diagnosis not present

## 2015-12-30 ENCOUNTER — Ambulatory Visit
Admission: RE | Admit: 2015-12-30 | Discharge: 2015-12-30 | Disposition: A | Discharge status: Home / Self Care | Payer: Medicare Other | Source: Ambulatory Visit | Attending: Radiation Oncology | Admitting: Radiation Oncology

## 2015-12-30 DIAGNOSIS — Z510 Encounter for antineoplastic radiation therapy: Secondary | ICD-10-CM | POA: Diagnosis not present

## 2015-12-30 DIAGNOSIS — C61 Malignant neoplasm of prostate: Secondary | ICD-10-CM | POA: Diagnosis not present

## 2015-12-31 ENCOUNTER — Ambulatory Visit: Payer: Medicare Other

## 2015-12-31 ENCOUNTER — Ambulatory Visit
Admission: RE | Admit: 2015-12-31 | Discharge: 2015-12-31 | Disposition: A | Discharge status: Home / Self Care | Payer: Medicare Other | Source: Ambulatory Visit | Attending: Radiation Oncology | Admitting: Radiation Oncology

## 2015-12-31 DIAGNOSIS — Z510 Encounter for antineoplastic radiation therapy: Secondary | ICD-10-CM | POA: Diagnosis not present

## 2015-12-31 DIAGNOSIS — C61 Malignant neoplasm of prostate: Secondary | ICD-10-CM | POA: Diagnosis not present

## 2016-01-01 ENCOUNTER — Encounter: Payer: Self-pay | Admitting: Radiation Oncology

## 2016-01-01 ENCOUNTER — Ambulatory Visit
Admission: RE | Admit: 2016-01-01 | Discharge: 2016-01-01 | Disposition: A | Discharge status: Home / Self Care | Payer: Medicare Other | Source: Ambulatory Visit | Attending: Radiation Oncology | Admitting: Radiation Oncology

## 2016-01-01 ENCOUNTER — Ambulatory Visit: Payer: Medicare Other

## 2016-01-01 VITALS — BP 119/76 | HR 56 | Resp 16 | Wt 209.9 lb

## 2016-01-01 DIAGNOSIS — C61 Malignant neoplasm of prostate: Secondary | ICD-10-CM

## 2016-01-01 DIAGNOSIS — Z510 Encounter for antineoplastic radiation therapy: Secondary | ICD-10-CM | POA: Diagnosis not present

## 2016-01-01 NOTE — Progress Notes (Signed)
Weight and vitals stable. Reports left knee is aching from side stepping to keep from falling over house cat. Reports nasal congestion has resolved. Reports manageable dysuria. Denies hematuria. Reports urinary frequency. Denies incontinence but, reports occasional leakage. Reports nocturia every 1-1.5 hours. Reports fatigue. One month follow up appointment card given. Patient understands to contact this RN with future needs.   BP 119/76 mmHg  Pulse 56  Resp 16  Wt 209 lb 14.4 oz (95.21 kg)  SpO2 100% Wt Readings from Last 3 Encounters:  01/01/16 209 lb 14.4 oz (95.21 kg)  12/25/15 211 lb 9.6 oz (95.981 kg)  12/18/15 213 lb (96.616 kg)

## 2016-01-01 NOTE — Progress Notes (Signed)
  Radiation Oncology         [phone]   Name: Carl Bowen MRN: [id number]   Date: 01/01/2016  DOB: Dec 06, 1955     Weekly Radiation Therapy Management    ICD-9-CM ICD-10-CM   1. Prostate cancer ([place]) 185 C61     Current Dose: 64.8 Gy Planned Dose:  68.4 Gy  Narrative The patient presents for routine under treatment assessment.  Weight and vitals stable. Reports left knee is aching from side stepping to keep from falling over house cat. Reports nasal congestion has resolved. Reports manageable dysuria. Denies hematuria. Reports urinary frequency. Denies incontinence but, reports occasional leakage. Reports nocturia every 1-1.5 hours. Reports fatigue. One month follow up appointment card given. Patient understands to contact this RN with future needs.   Set-up films were reviewed. The chart was checked.  Physical Findings  weight is 209 lb 14.4 oz (95.21 kg). His blood pressure is 119/76 and his pulse is 56. His respiration is 16 and oxygen saturation is 100%.   In general this is a well appearing Caucasian male. He is alert and oriented x4 and appropriate throughout the examination. Weight essentially stable.  No significant changes.  Impression The patient is tolerating radiation.  Plan We will proceed with continued radiotherapy treatment. The patient will return for follow up in 1 month.       Sheral Apley Tammi Klippel, M.D.    This document serves as a record of services personally performed by Tyler Pita, MD. It was created on his behalf by Arlyce Harman, a trained medical scribe. The creation of this record is based on the scribe's personal observations and the provider's statements to them. This document has been checked and approved by the attending provider.

## 2016-01-04 ENCOUNTER — Ambulatory Visit: Payer: Medicare Other

## 2016-01-04 DIAGNOSIS — C61 Malignant neoplasm of prostate: Secondary | ICD-10-CM | POA: Diagnosis not present

## 2016-01-04 DIAGNOSIS — Z510 Encounter for antineoplastic radiation therapy: Secondary | ICD-10-CM | POA: Diagnosis not present

## 2016-01-05 ENCOUNTER — Encounter: Payer: Self-pay | Admitting: Radiation Oncology

## 2016-01-05 ENCOUNTER — Ambulatory Visit: Payer: Medicare Other

## 2016-01-05 DIAGNOSIS — C61 Malignant neoplasm of prostate: Secondary | ICD-10-CM | POA: Diagnosis not present

## 2016-01-05 DIAGNOSIS — Z510 Encounter for antineoplastic radiation therapy: Secondary | ICD-10-CM | POA: Diagnosis not present

## 2016-01-06 ENCOUNTER — Telehealth: Payer: Self-pay | Admitting: Medical Oncology

## 2016-01-06 NOTE — Telephone Encounter (Signed)
Oncology Nurse Navigator Documentation  Oncology Nurse Navigator Flowsheets 11/23/2015 12/24/2015 01/06/2016  Navigator Location - - -  Navigator Encounter Type Treatment Treatment Telephone- I called Carl Bowen to congratulate him on completing his radiation yesterday. I had planned to be there but explained to him I was called jury duty. He states he is doing well except he is really tired. We discussed how the androgen deprivation causes fatigue and the radiation does also. He should begin to see improvement in his energy now he has completed his radiation. He will follow up with Dr. Tammi Klippel 02/11/16. I asked him to call me with any questions or concerns. He voiced understanding.  Telephone - - Outgoing Call  Abnormal Finding Date - - -  Confirmed Diagnosis Date - - -  Surgery Date - - -  Treatment Initiated Date - - -  Patient Visit Type - - -  Treatment Phase Treatment Treatment Final Radiation Tx  Barriers/Navigation Needs No barriers at this time No barriers at this time Education  Education - - Pain/ Symptom Management  Interventions - - -  Education Method - - -  Support Groups/Services Friends and Family Friends and Family -  Acuity - Level 1 -  Acuity Level 1 - Minimal follow up required -  Acuity Level 2 - - -  Time Spent with Patient 15 30 15

## 2016-01-17 NOTE — Progress Notes (Signed)
  Radiation Oncology         [phone] ________________________________  Name: Carl Bowen MRN: [id number]  Date: 01/05/2016  DOB: March 13, 1956  End of Treatment Note   ICD-9-CM ICD-10-CM    1. Prostate cancer ([place]) 45 C61     DIAGNOSIS: 60 y.o. gentleman with detectable rising PSA of 0.18 s/p prostatectomy with high risk pathology features     Indication for treatment:  Curative, salvage Prostatic Fossa Radiotherapy       Radiation treatment dates:   11/10/2015-01/05/2016  Site/dose:   The prostatic fossa was treated to 68.4 Gy in 38 fractions of 1.8 Gy  Beams/energy:   The prostatic fossa was treated using helical intensity modulated radiotherapy delivering 6 megavolt photons. Image guidance was performed with megavoltage CT studies prior to each fraction. He was immobilized with a body fix lower extremity mold.  Narrative: The patient tolerated radiation treatment relatively well.   Weight and vitals stable. Reported left knee is aching from side stepping to keep from falling over house cat. Reports nasal congestion has resolved. Reports manageable dysuria. Denies hematuria. Reports urinary frequency. Denies incontinence but, reports occasional leakage. Reports nocturia every 1-1.5 hours. Reports fatigue.  Plan: The patient has completed radiation treatment. He will return to radiation oncology clinic for routine followup in one month. I advised him to call or return sooner if he has any questions or concerns related to his recovery or treatment. ________________________________  Sheral Apley. Tammi Klippel, M.D.

## 2016-02-11 ENCOUNTER — Ambulatory Visit: Payer: Medicare Other | Admitting: Radiation Oncology

## 2016-02-17 ENCOUNTER — Ambulatory Visit: Payer: Medicare Other | Admitting: Radiation Oncology

## 2016-02-18 ENCOUNTER — Encounter: Payer: Self-pay | Admitting: Radiation Oncology

## 2016-02-18 ENCOUNTER — Ambulatory Visit
Admission: RE | Admit: 2016-02-18 | Discharge: 2016-02-18 | Disposition: A | Discharge status: Home / Self Care | Payer: Medicare Other | Source: Ambulatory Visit | Attending: Radiation Oncology | Admitting: Radiation Oncology

## 2016-02-18 VITALS — BP 119/89 | HR 69 | Temp 98.4°F | Ht 71.0 in | Wt 214.6 lb

## 2016-02-18 DIAGNOSIS — C61 Malignant neoplasm of prostate: Secondary | ICD-10-CM

## 2016-02-18 NOTE — Addendum Note (Signed)
Encounter addended by: Hayden Pedro, PA-C on: 02/18/2016  1:57 PM<BR>     Documentation filed: Notes Section

## 2016-02-18 NOTE — Progress Notes (Addendum)
  Radiation Oncology         [phone]) [phone] ________________________________  Name: Carl Bowen MRN: [id number]  Date: 02/18/2016  DOB: July 22, 1956  Follow-Up Visit Note  CC: No PCP Per Patient  [name], MD  Diagnosis:   61 y.o. with a history of prostate cancer who developed a detectable rising PSA of 0.18 s/p prostatectomy with high risk pathology features    ICD-9-CM ICD-10-CM   1. Prostate cancer ([facility]) 185 C61     Interval Since Last Radiation:  6  weeks   11/10/2015-01/05/2016:The prostatic fossa was treated to 68.4 Gy in 38 fractions of 1.8 Gy  Narrative:  The patient returns today for routine follow-up. He met with Dr. Alinda Money in March and his PSA was <0.01. He tolerated radiotherapy well during treatment and his urinary symptoms were persistent though had been his baseline prior to treatment following his prostatectomy  On review of systems, the patient reports he is doing well. He denies any progressive urinary symptoms. He continues to have intermittent incontinence. He denies dysuria, hematuria, shortness of breath, chest pain. He has had multiple skin lesions, and reports that he continues to have itching of his skin, and would like to meet with her dermatologist for a skin screening. No other complaints or verbalized.                         ALLERGIES:  has No Known Allergies.  Meds: Current Outpatient Prescriptions  Medication Sig Dispense Refill  . acetaminophen (TYLENOL) 500 MG tablet Take 1,000 mg by mouth every 6 (six) hours as needed for moderate pain or headache. Reported on 11/20/2015    . ibuprofen (ADVIL,MOTRIN) 400 MG tablet Take 400 mg by mouth every 6 (six) hours as needed.     No current facility-administered medications for this encounter.    Physical Findings:  height is '5\' 11"'$  (1.803 m) and weight is 214 lb 9.6 oz (97.342 kg). His temperature is 98.4 F (36.9 C). His blood pressure is 119/89 and his pulse is 69. His oxygen saturation is 97%.   In  general this is a well appearing caucasian male in no acute distress. He's alert and oriented x4 and appropriate throughout the examination. Cardiopulmonary assessment is negative for acute distress and he exhibits normal effort.   Lab Findings: Lab Results  Component Value Date   WBC 11.8* 12/23/2014   HGB 13.3 12/30/2014   HCT 41.2 12/30/2014   PLT 210 12/23/2014    Lab Results  Component Value Date   NA 135 12/23/2014   K 4.4 12/23/2014   CO2 24 12/23/2014   GLUCOSE 104* 12/23/2014   BUN 14 12/23/2014   CREATININE 1.01 12/23/2014   CALCIUM 9.3 12/23/2014   ANIONGAP 7 12/23/2014    Radiographic Findings: No results found.  Impression/Plan:   1. Detectable rising PSA of 0.18 s/p prostatectomy with high risk pathology features. The patient tolerated radiotherapy well and will be following up with Dr. Alinda Money for repeat PSA in October 2017. We would be happy to see the patient in the future on an as needed basis.  2. Dermatologic issues. The patient is interested in seeing evaluated by dermatologist as he is concerned that he has several areas that need to be evaluated. We will set him up with an appointment with the [facility] in [place]. He states agreement and standing.     Carola Rhine, PAC

## 2016-02-18 NOTE — Progress Notes (Signed)
Carl Bowen is here for follow up of radiation completed 01/05/16 to his Prostate. He denies pain though he does admit to chronic back pain, and a dull pain in his left leg and groin. He also denies burning with urination. He does admit to urinary frequency, and frequent nocturia. He states for example last night he was up every hour going to the bathroom. He has some incontinence, and reports dribbling at times, but it has improved greatly since completing radiation. He is performing pelvic exercises per recommendation from his therapist. He does admit to some fatigue. He also tells me he has several spots over his body especially his back and abdomen that he thinks might be skin cancer, they will bleed, scab over, then bleed again. He states he needs to make a dermatologist appointment.   BP 119/89 mmHg  Pulse 69  Temp(Src) 98.4 F (36.9 C)  Ht 5\' 11"  (1.803 m)  Wt 214 lb 9.6 oz (97.342 kg)  BMI 29.94 kg/m2  SpO2 97%   Wt Readings from Last 3 Encounters:  02/18/16 214 lb 9.6 oz (97.342 kg)  01/01/16 209 lb 14.4 oz (95.21 kg)  12/25/15 211 lb 9.6 oz (95.981 kg)

## 2016-02-23 ENCOUNTER — Telehealth: Payer: Self-pay | Admitting: *Deleted

## 2016-02-23 NOTE — Telephone Encounter (Signed)
CALLED PATIENT TO INFORM OF APPT. [place] ON 03-25-16 @ 9:30 AM, ADDRESS - [address], PH. NO. - [phone], SPOKE WITH PATIENT AND HE IS AWARE OF THIS APPT.

## 2016-05-12 DIAGNOSIS — D1801 Hemangioma of skin and subcutaneous tissue: Secondary | ICD-10-CM | POA: Diagnosis not present

## 2016-05-12 DIAGNOSIS — C44519 Basal cell carcinoma of skin of other part of trunk: Secondary | ICD-10-CM | POA: Diagnosis not present

## 2016-05-12 DIAGNOSIS — D485 Neoplasm of uncertain behavior of skin: Secondary | ICD-10-CM | POA: Diagnosis not present

## 2016-05-12 DIAGNOSIS — L814 Other melanin hyperpigmentation: Secondary | ICD-10-CM | POA: Diagnosis not present

## 2016-05-12 DIAGNOSIS — L821 Other seborrheic keratosis: Secondary | ICD-10-CM | POA: Diagnosis not present

## 2016-06-29 DIAGNOSIS — C44519 Basal cell carcinoma of skin of other part of trunk: Secondary | ICD-10-CM | POA: Diagnosis not present

## 2016-07-13 DIAGNOSIS — C61 Malignant neoplasm of prostate: Secondary | ICD-10-CM | POA: Diagnosis not present

## 2016-07-20 DIAGNOSIS — C61 Malignant neoplasm of prostate: Secondary | ICD-10-CM | POA: Diagnosis not present

## 2016-07-20 DIAGNOSIS — N393 Stress incontinence (female) (male): Secondary | ICD-10-CM | POA: Diagnosis not present

## 2016-07-22 ENCOUNTER — Telehealth: Payer: Self-pay | Admitting: Medical Oncology

## 2016-07-22 NOTE — Progress Notes (Signed)
Mr. Laessig states that he has had some urinary leakage since completing radiation. He has not been doing his pelvic exercises. We talked about restarting them to strengthen his pelvic muscles. He also states he has been drinking more caffeine and not enough water. We discussed that the caffeine can make it worse. He voiced understanding. I asked him to call me if I can be of assistance to him.

## 2016-11-24 ENCOUNTER — Telehealth: Payer: Self-pay | Admitting: Medical Oncology

## 2016-11-24 NOTE — Progress Notes (Signed)
Carl Bowen called stating that he has pain in his left groin, left leg and knee. He has a history of back pain so he is not sure if this is related to his back or side effects of Lupron. He read that muscle aches and joint pain are side effects. He reports no tenderness or swelling in his left leg, ankle or foot. He has taken ibuprofen and this relieves the pain. He follows up with Dr. Alinda Money in April for another 6 month injection of Lupron. I asked him to call him if the pain gets worse or he notes swelling and or tenderness in his left leg. He voiced understanding.

## 2017-01-13 DIAGNOSIS — C61 Malignant neoplasm of prostate: Secondary | ICD-10-CM | POA: Diagnosis not present

## 2017-01-20 DIAGNOSIS — C61 Malignant neoplasm of prostate: Secondary | ICD-10-CM | POA: Diagnosis not present

## 2017-01-20 DIAGNOSIS — N393 Stress incontinence (female) (male): Secondary | ICD-10-CM | POA: Diagnosis not present

## 2017-06-29 DIAGNOSIS — C61 Malignant neoplasm of prostate: Secondary | ICD-10-CM | POA: Diagnosis not present

## 2017-06-29 DIAGNOSIS — R310 Gross hematuria: Secondary | ICD-10-CM | POA: Diagnosis not present

## 2017-07-11 DIAGNOSIS — K760 Fatty (change of) liver, not elsewhere classified: Secondary | ICD-10-CM | POA: Diagnosis not present

## 2017-07-11 DIAGNOSIS — R310 Gross hematuria: Secondary | ICD-10-CM | POA: Diagnosis not present

## 2017-08-16 DIAGNOSIS — C61 Malignant neoplasm of prostate: Secondary | ICD-10-CM | POA: Diagnosis not present

## 2017-08-23 DIAGNOSIS — N393 Stress incontinence (female) (male): Secondary | ICD-10-CM | POA: Diagnosis not present

## 2017-08-23 DIAGNOSIS — C61 Malignant neoplasm of prostate: Secondary | ICD-10-CM | POA: Diagnosis not present

## 2017-08-23 DIAGNOSIS — R310 Gross hematuria: Secondary | ICD-10-CM | POA: Diagnosis not present

## 2017-10-04 DIAGNOSIS — N393 Stress incontinence (female) (male): Secondary | ICD-10-CM | POA: Diagnosis not present

## 2017-10-04 DIAGNOSIS — M6281 Muscle weakness (generalized): Secondary | ICD-10-CM | POA: Diagnosis not present

## 2017-10-04 DIAGNOSIS — N3941 Urge incontinence: Secondary | ICD-10-CM | POA: Diagnosis not present

## 2017-10-04 DIAGNOSIS — R102 Pelvic and perineal pain: Secondary | ICD-10-CM | POA: Diagnosis not present

## 2017-10-04 DIAGNOSIS — M62838 Other muscle spasm: Secondary | ICD-10-CM | POA: Diagnosis not present

## 2017-11-08 DIAGNOSIS — N3941 Urge incontinence: Secondary | ICD-10-CM | POA: Diagnosis not present

## 2017-11-08 DIAGNOSIS — M6281 Muscle weakness (generalized): Secondary | ICD-10-CM | POA: Diagnosis not present

## 2017-11-08 DIAGNOSIS — M62838 Other muscle spasm: Secondary | ICD-10-CM | POA: Diagnosis not present

## 2017-11-08 DIAGNOSIS — R102 Pelvic and perineal pain: Secondary | ICD-10-CM | POA: Diagnosis not present

## 2017-11-08 DIAGNOSIS — N393 Stress incontinence (female) (male): Secondary | ICD-10-CM | POA: Diagnosis not present

## 2018-02-28 DIAGNOSIS — C61 Malignant neoplasm of prostate: Secondary | ICD-10-CM | POA: Diagnosis not present

## 2018-03-07 DIAGNOSIS — N393 Stress incontinence (female) (male): Secondary | ICD-10-CM | POA: Diagnosis not present

## 2018-03-07 DIAGNOSIS — N3041 Irradiation cystitis with hematuria: Secondary | ICD-10-CM | POA: Diagnosis not present

## 2018-03-07 DIAGNOSIS — C61 Malignant neoplasm of prostate: Secondary | ICD-10-CM | POA: Diagnosis not present

## 2018-04-17 DIAGNOSIS — R102 Pelvic and perineal pain: Secondary | ICD-10-CM | POA: Diagnosis not present

## 2018-04-17 DIAGNOSIS — M4800 Spinal stenosis, site unspecified: Secondary | ICD-10-CM | POA: Diagnosis not present

## 2018-04-17 DIAGNOSIS — N393 Stress incontinence (female) (male): Secondary | ICD-10-CM | POA: Diagnosis not present

## 2018-04-17 DIAGNOSIS — M6281 Muscle weakness (generalized): Secondary | ICD-10-CM | POA: Diagnosis not present

## 2018-04-17 DIAGNOSIS — M62838 Other muscle spasm: Secondary | ICD-10-CM | POA: Diagnosis not present

## 2018-04-17 DIAGNOSIS — N3941 Urge incontinence: Secondary | ICD-10-CM | POA: Diagnosis not present

## 2018-08-30 DIAGNOSIS — N3941 Urge incontinence: Secondary | ICD-10-CM | POA: Diagnosis not present

## 2018-08-30 DIAGNOSIS — N393 Stress incontinence (female) (male): Secondary | ICD-10-CM | POA: Diagnosis not present

## 2018-08-30 DIAGNOSIS — R102 Pelvic and perineal pain: Secondary | ICD-10-CM | POA: Diagnosis not present

## 2018-08-30 DIAGNOSIS — M62838 Other muscle spasm: Secondary | ICD-10-CM | POA: Diagnosis not present

## 2018-08-30 DIAGNOSIS — M6281 Muscle weakness (generalized): Secondary | ICD-10-CM | POA: Diagnosis not present

## 2018-10-03 DIAGNOSIS — C61 Malignant neoplasm of prostate: Secondary | ICD-10-CM | POA: Diagnosis not present

## 2018-10-10 DIAGNOSIS — C61 Malignant neoplasm of prostate: Secondary | ICD-10-CM | POA: Diagnosis not present

## 2018-10-27 DIAGNOSIS — M25512 Pain in left shoulder: Secondary | ICD-10-CM | POA: Diagnosis not present

## 2018-10-27 DIAGNOSIS — R52 Pain, unspecified: Secondary | ICD-10-CM | POA: Diagnosis not present

## 2019-04-05 DIAGNOSIS — M1990 Unspecified osteoarthritis, unspecified site: Secondary | ICD-10-CM | POA: Diagnosis not present

## 2019-04-05 DIAGNOSIS — R350 Frequency of micturition: Secondary | ICD-10-CM | POA: Diagnosis not present

## 2019-04-05 DIAGNOSIS — R319 Hematuria, unspecified: Secondary | ICD-10-CM | POA: Diagnosis not present

## 2019-04-05 DIAGNOSIS — N390 Urinary tract infection, site not specified: Secondary | ICD-10-CM | POA: Diagnosis not present

## 2019-04-25 DIAGNOSIS — R300 Dysuria: Secondary | ICD-10-CM | POA: Diagnosis not present

## 2019-04-25 DIAGNOSIS — N3001 Acute cystitis with hematuria: Secondary | ICD-10-CM | POA: Diagnosis not present

## 2019-05-02 DIAGNOSIS — C61 Malignant neoplasm of prostate: Secondary | ICD-10-CM | POA: Diagnosis not present

## 2019-05-08 DIAGNOSIS — R8271 Bacteriuria: Secondary | ICD-10-CM | POA: Diagnosis not present

## 2019-05-08 DIAGNOSIS — N393 Stress incontinence (female) (male): Secondary | ICD-10-CM | POA: Diagnosis not present

## 2019-05-08 DIAGNOSIS — C61 Malignant neoplasm of prostate: Secondary | ICD-10-CM | POA: Diagnosis not present

## 2023-05-26 LAB — COLOGUARD: COLOGUARD: NEGATIVE

## 2023-07-31 ENCOUNTER — Other Ambulatory Visit: Payer: Self-pay | Admitting: Urology

## 2023-11-16 ENCOUNTER — Other Ambulatory Visit: Payer: Self-pay | Admitting: Urology

## 2023-11-20 ENCOUNTER — Other Ambulatory Visit: Payer: Self-pay | Admitting: Urology

## 2024-01-08 NOTE — Patient Instructions (Signed)
 SURGICAL WAITING ROOM VISITATION  Patients having surgery or a procedure may have no more than 2 support people in the waiting area - these visitors may rotate.    Children under the age of 36 must have an adult with them who is not the patient.  Due to an increase in RSV and influenza rates and associated hospitalizations, children ages 88 and under may not visit patients in [facility] hospitals.  Visitors with respiratory illnesses are discouraged from visiting and should remain at home.  If the patient needs to stay at the hospital during part of their recovery, the visitor guidelines for inpatient rooms apply. Pre-op nurse will coordinate an appropriate time for 1 support person to accompany patient in pre-op.  This support person may not rotate.    Please refer to the [facility] website for the visitor guidelines for Inpatients (after your surgery is over and you are in a regular room).       Your procedure is scheduled on: 01/16/24   Report to [facility] Main Entrance    Report to admitting at 5:15 AM   Call this number if you have problems the morning of surgery [phone]   Do not eat food or drink liquids :After Midnight. But may have sips of water with meds.   After Midnight you may have the following liquids until ______ AM/ PM DAY OF SURGERY    FOLLOW BOWEL PREP AND ANY ADDITIONAL PRE OP INSTRUCTIONS YOU RECEIVED FROM YOUR SURGEON'S OFFICE!!!     Oral Hygiene is also important to reduce your risk of infection.                                    Remember - BRUSH YOUR TEETH THE MORNING OF SURGERY WITH YOUR REGULAR TOOTHPASTE  DENTURES WILL BE REMOVED PRIOR TO SURGERY PLEASE DO NOT APPLY "Poly grip" OR ADHESIVES!!!   Stop all vitamins and herbal supplements 7 days before surgery.   Take these medicines the morning of surgery with A SIP OF WATER: esomeprazole(nexium), tylenol             You may not have any metal on your body including hair pins,  jewelry, and body piercing             Do not wear make-up, lotions, powders, perfumes/cologne, or deodorant              Men may shave face and neck.   Do not bring valuables to the hospital.  IS NOT             RESPONSIBLE   FOR VALUABLES.   Contacts, glasses, dentures or bridgework may not be worn into surgery.   Bring small overnight bag day of surgery.   DO NOT BRING YOUR HOME MEDICATIONS TO THE HOSPITAL. PHARMACY WILL DISPENSE MEDICATIONS LISTED ON YOUR MEDICATION LIST TO YOU DURING YOUR ADMISSION IN THE HOSPITAL!    Patients discharged on the day of surgery will not be allowed to drive home.  Someone NEEDS to stay with you for the first 24 hours after anesthesia.   Special Instructions: Bring a copy of your healthcare power of attorney and living will documents the day of surgery if you haven't scanned them before.              Please read over the following fact sheets you were given: IF YOU HAVE QUESTIONS ABOUT  YOUR PRE-OP INSTRUCTIONS PLEASE CALL [phone] Carl Bowen   If you received a COVID test during your pre-op visit  it is requested that you wear a mask when out in public, stay away from anyone that may not be feeling well and notify your surgeon if you develop symptoms. If you test positive for Covid or have been in contact with anyone that has tested positive in the last 10 days please notify you surgeon.    [facility] - Preparing for Surgery Before surgery, you can play an important role.  Because skin is not sterile, your skin needs to be as free of germs as possible.  You can reduce the number of germs on your skin by washing with CHG (chlorahexidine gluconate) soap before surgery.  CHG is an antiseptic cleaner which kills germs and bonds with the skin to continue killing germs even after washing. Please DO NOT use if you have an allergy to CHG or antibacterial soaps.  If your skin becomes reddened/irritated stop using the CHG and inform your nurse when you  arrive at Short Stay. Do not shave (including legs and underarms) for at least 48 hours prior to the first CHG shower.  You may shave your face/neck.  Please follow these instructions carefully:  1.  Shower with CHG Soap the night before surgery and the  morning of surgery.  2.  If you choose to wash your hair, wash your hair first as usual with your normal  shampoo.  3.  After you shampoo, rinse your hair and body thoroughly to remove the shampoo.                             4.  Use CHG as you would any other liquid soap.  You can apply chg directly to the skin and wash.  Gently with a scrungie or clean washcloth.  5.  Apply the CHG Soap to your body ONLY FROM THE NECK DOWN.   Do   not use on face/ open                           Wound or open sores. Avoid contact with eyes, ears mouth and   genitals (private parts).                       Wash face,  Genitals (private parts) with your normal soap.             6.  Wash thoroughly, paying special attention to the area where your    surgery  will be performed.  7.  Thoroughly rinse your body with warm water from the neck down.  8.  DO NOT shower/wash with your normal soap after using and rinsing off the CHG Soap.                9.  Pat yourself dry with a clean towel.            10.  Wear clean pajamas.            11.  Place clean sheets on your bed the night of your first shower and do not  sleep with pets. Day of Surgery : Do not apply any lotions/deodorants the morning of surgery.  Please wear clean clothes to the hospital/surgery center.  FAILURE TO FOLLOW THESE INSTRUCTIONS MAY RESULT IN THE CANCELLATION OF YOUR SURGERY  PATIENT SIGNATURE_________________________________  NURSE SIGNATURE__________________________________  ________________________________________________________________________

## 2024-01-08 NOTE — Progress Notes (Signed)
 COVID Vaccine received:  []  No []  Yes Date of any COVID positive Test in last 90 days:  PCP - Dr. Shawn Lazoff [facility] in [place] Cardiologist -   Chest x-ray -  EKG -   Stress Test -  ECHO -  Cardiac Cath -   Bowel Prep - []  No  []   Yes ______  Pacemaker / ICD device []  No []  Yes   Spinal Cord Stimulator:[]  No []  Yes       History of Sleep Apnea? []  No []  Yes   CPAP used?- []  No []  Yes    Does the patient monitor blood sugar?          []  No []  Yes  []  N/A  Patient has: []  NO Hx DM   []  Pre-DM                 []  DM1  []   DM2 Does patient have a [employer] or [employer]? []  No []  Yes   Fasting Blood Sugar Ranges-  Checks Blood Sugar _____ times a day  GLP1 agonist / usual dose -  GLP1 instructions:  SGLT-2 inhibitors / usual dose -  SGLT-2 instructions:   Blood Thinner / Instructions: Aspirin Instructions:  Comments:   Activity level: Patient is able / unable to climb a flight of stairs without difficulty; []  No CP  []  No SOB, but would have ___   Patient can / can not perform ADLs without assistance.   Anesthesia review:   Patient denies shortness of breath, fever, cough and chest pain at PAT appointment.  Patient verbalized understanding and agreement to the Pre-Surgical Instructions that were given to them at this PAT appointment. Patient was also educated of the need to review these PAT instructions again prior to his/her surgery.I reviewed the appropriate phone numbers to call if they have any and questions or concerns.

## 2024-01-09 ENCOUNTER — Encounter (HOSPITAL_COMMUNITY)
Admission: RE | Admit: 2024-01-09 | Discharge: 2024-01-09 | Disposition: A | Discharge status: Home / Self Care | Source: Ambulatory Visit | Attending: Anesthesiology | Admitting: Anesthesiology

## 2024-01-09 DIAGNOSIS — Z01818 Encounter for other preprocedural examination: Secondary | ICD-10-CM

## 2024-01-09 NOTE — Progress Notes (Signed)
 Patient did not show for PAT scheduled for today.  Phoned patient and he stated that he is canceling PAT and surgery.  Stated that he left a message for Zona at Dr. Zacarias Hermann office.  I also notified Zona.

## 2024-01-09 NOTE — Progress Notes (Signed)
 COVID Vaccine Completed:  Yes  Date of COVID positive in last 90 days:  PCP - Shawn Lazoff, DO Cardiologist -   Chest x-ray -  EKG -  Stress Test -  ECHO -  Cardiac Cath -  Pacemaker/ICD device last checked: Spinal Cord Stimulator:  Bowel Prep -   Sleep Study -  CPAP -   Fasting Blood Sugar -  Checks Blood Sugar _____ times a day  Last dose of GLP1 agonist-  N/A GLP1 instructions:  Hold 7 days before surgery    Last dose of SGLT-2 inhibitors-  N/A SGLT-2 instructions:  Hold 3 days before surgery    Blood Thinner Instructions:  Last dose:   Time: Aspirin Instructions: Last Dose:  Activity level:  Can go up a flight of stairs and perform activities of daily living without stopping and without symptoms of chest pain or shortness of breath.  Able to exercise without symptoms  Unable to go up a flight of stairs without symptoms of     Anesthesia review:   Patient denies shortness of breath, fever, cough and chest pain at PAT appointment  Patient verbalized understanding of instructions that were given to them at the PAT appointment. Patient was also instructed that they will need to review over the PAT instructions again at home before surgery.

## 2024-01-16 ENCOUNTER — Encounter (HOSPITAL_COMMUNITY): Admission: RE | Payer: Self-pay | Source: Home / Self Care

## 2024-01-16 ENCOUNTER — Ambulatory Visit (HOSPITAL_COMMUNITY): Admission: RE | Admit: 2024-01-16 | Payer: Medicare Other | Source: Home / Self Care | Admitting: Urology

## 2024-01-16 SURGERY — INSERTION, ARTIFICIAL URINARY SPHINCTER
Anesthesia: General

## 2024-06-25 ENCOUNTER — Emergency Department (HOSPITAL_COMMUNITY)
Admission: EM | Admit: 2024-06-25 | Discharge: 2024-06-25 | Disposition: A | Discharge status: Home / Self Care | Attending: Emergency Medicine | Admitting: Emergency Medicine

## 2024-06-25 ENCOUNTER — Other Ambulatory Visit: Payer: Self-pay

## 2024-06-25 ENCOUNTER — Emergency Department (HOSPITAL_COMMUNITY)

## 2024-06-25 ENCOUNTER — Encounter (HOSPITAL_COMMUNITY): Payer: Self-pay

## 2024-06-25 DIAGNOSIS — J449 Chronic obstructive pulmonary disease, unspecified: Secondary | ICD-10-CM | POA: Insufficient documentation

## 2024-06-25 DIAGNOSIS — Z8546 Personal history of malignant neoplasm of prostate: Secondary | ICD-10-CM | POA: Diagnosis not present

## 2024-06-25 DIAGNOSIS — S92522B Displaced fracture of medial phalanx of left lesser toe(s), initial encounter for open fracture: Secondary | ICD-10-CM | POA: Insufficient documentation

## 2024-06-25 DIAGNOSIS — S99922A Unspecified injury of left foot, initial encounter: Secondary | ICD-10-CM | POA: Diagnosis present

## 2024-06-25 DIAGNOSIS — W01198A Fall on same level from slipping, tripping and stumbling with subsequent striking against other object, initial encounter: Secondary | ICD-10-CM | POA: Insufficient documentation

## 2024-06-25 MED ORDER — OXYCODONE-ACETAMINOPHEN 5-325 MG PO TABS
1.0000 | ORAL_TABLET | Freq: Once | ORAL | Status: AC
  Administered 2024-06-25: 1 via ORAL
  Filled 2024-06-25: qty 1

## 2024-06-25 MED ORDER — KETOROLAC TROMETHAMINE 60 MG/2ML IM SOLN
30.0000 mg | Freq: Once | INTRAMUSCULAR | Status: AC
  Administered 2024-06-25: 30 mg via INTRAMUSCULAR
  Filled 2024-06-25: qty 2

## 2024-06-25 MED ORDER — OXYCODONE-ACETAMINOPHEN 5-325 MG PO TABS: 1.0000 | ORAL_TABLET | Freq: Three times a day (TID) | ORAL | 0 refills | Status: AC | PRN

## 2024-06-25 MED ORDER — CEPHALEXIN 500 MG PO CAPS
500.0000 mg | ORAL_CAPSULE | Freq: Once | ORAL | Status: AC
  Administered 2024-06-25: 500 mg via ORAL
  Filled 2024-06-25: qty 1

## 2024-06-25 MED ORDER — TETANUS-DIPHTH-ACELL PERTUSSIS 5-2.5-18.5 LF-MCG/0.5 IM SUSY
0.5000 mL | PREFILLED_SYRINGE | Freq: Once | INTRAMUSCULAR | Status: AC
  Administered 2024-06-25: 0.5 mL via INTRAMUSCULAR
  Filled 2024-06-25: qty 0.5

## 2024-06-25 MED ORDER — CEPHALEXIN 500 MG PO CAPS: 500.0000 mg | ORAL_CAPSULE | Freq: Four times a day (QID) | ORAL | 0 refills | Status: AC

## 2024-06-25 NOTE — ED Provider Notes (Signed)
 [place] EMERGENCY DEPARTMENT AT [facility] Provider Note   CSN: [id number] Arrival date & time: 06/25/24  1536     Patient presents with: Foot Injury (left)   Carl Bowen is a 68 y.o. male.    Foot Injury Patient presents for left foot injury.  Medical history includes COPD, neuropathy, prostate cancer, GERD, chronic back pain.  Earlier today, he was in his normal state of health.  While having a friend move a TV, it fell, striking him on the distal aspect of his left foot.  He was wearing a shoe at the time.  Corner of the TV landed on area of middle toe.  He has since had pain and mild bleeding to this area.  He is unaware when his last tetanus shot was.     Prior to Admission medications   Medication Sig Start Date End Date Taking? Authorizing Provider  cephALEXin (KEFLEX) 500 MG capsule Take 1 capsule (500 mg total) by mouth 4 (four) times daily. 06/25/24  Yes Melvenia Motto, MD  oxyCODONE-acetaminophen  (PERCOCET/ROXICET) 5-325 MG tablet Take 1 tablet by mouth every 8 (eight) hours as needed for up to 3 days for severe pain (pain score 7-10). 06/25/24 06/28/24 Yes Melvenia Motto, MD  acetaminophen  (TYLENOL ) 500 MG tablet Take 1,000 mg by mouth every 6 (six) hours as needed for moderate pain or headache. Reported on 11/20/2015    [provider]  calcium carbonate (OS-CAL) 600 MG tablet Take 1,200 mg by mouth every morning.    [provider]  cholecalciferol (VITAMIN D3) 25 MCG (1000 UNIT) tablet Take 1,000 Units by mouth every morning.    [provider]  esomeprazole (NEXIUM) 20 MG capsule Take 20 mg by mouth daily. 08/09/22   [provider]    Allergies: Crestor [rosuvastatin]    Review of Systems  Musculoskeletal:  Positive for arthralgias and joint swelling.  Skin:  Positive for wound.  All other systems reviewed and are negative.   Updated Vital Signs BP (!) 138/92   Pulse 61   Temp 98 F (36.7 C) (Oral)   Resp 16   Ht  5' 11 (1.803 m)   Wt 97.3 kg   SpO2 98%   BMI 29.92 kg/m   Physical Exam Vitals and nursing note reviewed.  Constitutional:      General: He is not in acute distress.    Appearance: Normal appearance. He is well-developed. He is not ill-appearing, toxic-appearing or diaphoretic.  HENT:     Head: Normocephalic and atraumatic.     Right Ear: External ear normal.     Left Ear: External ear normal.     Nose: Nose normal.     Mouth/Throat:     Mouth: Mucous membranes are moist.  Eyes:     Extraocular Movements: Extraocular movements intact.     Conjunctiva/sclera: Conjunctivae normal.  Cardiovascular:     Rate and Rhythm: Normal rate and regular rhythm.  Pulmonary:     Effort: Pulmonary effort is normal. No respiratory distress.  Abdominal:     General: There is no distension.     Palpations: Abdomen is soft.     Tenderness: There is no abdominal tenderness.  Musculoskeletal:        General: Signs of injury present. No swelling.     Cervical back: Normal range of motion and neck supple.  Skin:    General: Skin is warm and dry.  Neurological:     General: No focal deficit present.  Mental Status: He is alert and oriented to person, place, and time.  Psychiatric:        Mood and Affect: Mood normal.        Behavior: Behavior normal.     (all labs ordered are listed, but only abnormal results are displayed) Labs Reviewed - No data to display  EKG: None  Radiology: DG Foot Complete Left Result Date: 06/25/2024 CLINICAL DATA:  crush injury, distal 3rd digit EXAM: LEFT FOOT - COMPLETE 3+ VIEW COMPARISON:  None Available. FINDINGS: Osteopenia.Subtle lucency within the distal phalanx of the third digit. Longitudinal lucency may also be present within the distal phalangeal tuft of the fourth digit. No dislocation. There is no evidence of arthropathy or other focal bone abnormality. Soft tissues are unremarkable. No radiopaque foreign body. IMPRESSION: Subtle, nondisplaced  fracture through the distal phalanx of the third digit. There may also be a subtle nondisplaced fracture of the distal phalangeal tuft of the fourth digit. Electronically Signed   By: Rogelia Myers M.D.   On: 06/25/2024 16:27     Procedures   Medications Ordered in the ED  ketorolac  (TORADOL ) injection 30 mg (30 mg Intramuscular Given 06/25/24 1807)  Tdap (BOOSTRIX) injection 0.5 mL (0.5 mLs Intramuscular Given 06/25/24 1809)  cephALEXin (KEFLEX) capsule 500 mg (500 mg Oral Given 06/25/24 1806)  oxyCODONE-acetaminophen  (PERCOCET/ROXICET) 5-325 MG per tablet 1 tablet (1 tablet Oral Given 06/25/24 1806)                                    Medical Decision Making Amount and/or Complexity of Data Reviewed Radiology: ordered.  Risk Prescription drug management.   Patient presenting for left foot injury.  Vital signs on arrival were normal.  On exam, patient is overall well-appearing.  He has a wound to the distal aspect of his third digit on left foot.  Fourth digit does show some swelling.  X-ray imaging shows subtle nondisplaced fracture of distal phalanx of third digit with questionable nondisplaced fracture of distal phalangeal tuft of fourth digit.  Toradol  and Percocet were ordered for analgesia.  Tetanus was updated.  Patient to be given antibiotics for open fracture.  I spoke with podiatrist on-call, Dr. Tobie, who agrees with antibiotics and outpatient follow-up.  Patient underwent irrigation and cleaning of his wound.  Dressing was placed with Tegaderm and gauze.  Injured toes were buddy taped to noninjured toes.  Postop shoe was provided.  Patient was discharged in stable condition.     Final diagnoses:  Open displaced fracture of middle phalanx of lesser toe of left foot, initial encounter    ED Discharge Orders          Ordered    cephALEXin (KEFLEX) 500 MG capsule  4 times daily        06/25/24 1947    oxyCODONE-acetaminophen  (PERCOCET/ROXICET) 5-325 MG tablet  Every 8  hours PRN        06/25/24 1948               Melvenia Motto, MD 06/25/24 2040

## 2024-06-25 NOTE — ED Notes (Signed)
 Pt in bed, pt has dressing in placed, pt has bruising to 3rd and 4 th toe on L foot.  Bleeding is controled/ stopped at this time

## 2024-06-25 NOTE — Discharge Instructions (Signed)
 A prescription for antibiotics was sent to your pharmacy.  Take as prescribed.  Treat pain with over-the-counter ibuprofen and Tylenol .  If you have severe pain despite this, a prescription for narcotic pain medication was sent to your pharmacy.  Take this only as needed.  Call the telephone number below to set up a follow-up appointment with the foot doctor.  You should get seen in the office within the next few days.  Return to the emergency department for any new or worsening symptoms of concern.

## 2024-06-25 NOTE — ED Triage Notes (Signed)
 Pt arrived via POV c/o left foot crush injury. Pt reports a heavy large screen TV slid out of the back of his truck and crushed his foot with the sharp corner edge of the TV. Bleeding not controlled at this time.

## 2024-06-25 NOTE — ED Notes (Signed)
 Pts wound cleaned and clean dressing placed in Triage.

## 2024-06-26 ENCOUNTER — Ambulatory Visit (INDEPENDENT_AMBULATORY_CARE_PROVIDER_SITE_OTHER): Admitting: Podiatry

## 2024-06-26 ENCOUNTER — Encounter: Payer: Self-pay | Admitting: Podiatry

## 2024-06-26 DIAGNOSIS — S92532B Displaced fracture of distal phalanx of left lesser toe(s), initial encounter for open fracture: Secondary | ICD-10-CM

## 2024-06-29 NOTE — Progress Notes (Signed)
 Subjective:  Patient ID: Carl Bowen, male    DOB: [birth year],  MRN: [id number]  Chief Complaint  Patient presents with   Foot Injury    Patient is here for left foot 3rd and 4th toe fracture with blisters, contusion and pain    Discussed the use of AI scribe software for clinical note transcription with the patient, who gave verbal consent to proceed.  History of Present Illness Carl Bowen is a 68 year old male who presents with a foot injury after dropping a TV on it.  Yesterday afternoon, he dropped a flat screen TV on his foot while unloading it from his truck.  This occurred yesterday.  The sharp corner hit the top of his foot from about three feet up, resulting in a significant injury. He was seen in the emergency room where he received antibiotics and a tetanus shot.  The injury is very tender, with a sensation like 'a barrage of needles' when touched. The toenail on the third toe is lifted. His nails are thick and difficult to manage due to leg cramps and back pain.  He has ongoing back issues causing nerve damage and leg cramps, which are exacerbated by sitting for long periods. These symptoms impact his ability to care for his feet.  This has been a chronic issue for him.  He takes vitamins and has no diabetes or use of blood thinners. He has a history of foot injuries and toenail removal. No ankle pain.      Objective:  There were no vitals filed for this visit.  Physical Exam General: AAO x3, NAD  Dermatological: Superficial laceration noted along the third toe and the toenail is loose distally.  There is no active bleeding identified at this time there is no wounds or probe.  There is no exposed bone or tendon.  Bruising present as well as edema to the toes.  Looks like old blister which is popped present on the third toe.  Vascular: Dorsalis Pedis artery and Posterior Tibial artery pedal pulses are 2/4 bilateral with immedate capillary fill time.  There is no pain  with calf compression, swelling, warmth, erythema.   Neruologic: Grossly intact via light touch bilateral.   Musculoskeletal: Tenderness on the associated digits. Gait: Unassisted, Nonantalgic.      No images are attached to the encounter.    Results RADIOLOGY Foot X-ray: Fracture of the distal phalanges of the third and fourth toes (06/25/2024)   Assessment:  No diagnosis found.   Plan:  Patient was evaluated and treated and all questions answered.  Assessment and Plan Assessment & Plan Fractures of distal phalanges of right third and fourth toes with associated laceration and blister Acute nondisplaced fractures of the distal phalanges of the right third and fourth toes. No surgical intervention needed. Laceration and blister managed conservatively. - ED records reviewed and the wounds were irrigated and cleaned.  He received a tetanus in the ED.  Recommend continue with daily dressing changes and continue antibiotics.  Discussed possible nail removal would look seconds, come off on its own if it does not we will need to remove this. - Keep foot clean and dry. - Use Betadine to prevent infection. - Apply gauze to cover area. - Wear protective shoe to limit movement. - Limit walking, ice daily. - Monitor for infection signs: increased swelling, redness, drainage, fevers, chills. - Follow-up in two weeks for repeat x-rays and healing assessment.   Onychomycosis and onychogryphosis of right foot Onychomycosis  and onychogryphosis causing self-care difficulty due to back pain and leg cramps. Thickened nails challenging to manage. - Schedule regular appointments for nail care post-acute injury.       Return in about 2 years (around 06/26/2026) for toe fracture, wound; x-ray.   Donnice JONELLE Fees DPM

## 2024-07-11 ENCOUNTER — Ambulatory Visit

## 2024-07-11 ENCOUNTER — Ambulatory Visit (INDEPENDENT_AMBULATORY_CARE_PROVIDER_SITE_OTHER): Admitting: Podiatry

## 2024-07-11 DIAGNOSIS — S92532B Displaced fracture of distal phalanx of left lesser toe(s), initial encounter for open fracture: Secondary | ICD-10-CM | POA: Diagnosis not present

## 2024-07-11 DIAGNOSIS — S92532D Displaced fracture of distal phalanx of left lesser toe(s), subsequent encounter for fracture with routine healing: Secondary | ICD-10-CM | POA: Diagnosis not present

## 2024-07-15 NOTE — Progress Notes (Signed)
  Subjective:  Patient ID: Carl Bowen, male    DOB: Feb 22, 1956,  MRN: [id number]  No chief complaint on file.    History of Present Illness Carl Bowen is a 68 year old male who presents with a foot injury after dropping a TV on it.  States he had been doing much better.  He denies any bleeding or any open lesions.  His pain is also much improved.  Still in surgical shoe.  Does not report any fevers or chills.  His wife changing the dressing daily.  No other concerns.      Objective:  There were no vitals filed for this visit.  Physical Exam General: AAO x3, NAD  Dermatological: There are still some localized edema present to the 3rd and 4th toes with some faint erythema more inflammation as opposed to infection.  There is no drainage or pus.  No increase in temperature.  No fluctuation or crepitation but there is no laceration noted at this time or open lesions.   Vascular: Dorsalis Pedis artery and Posterior Tibial artery pedal pulses are 2/4 bilateral with immedate capillary fill time.  There is no pain with calf compression, swelling, warmth, erythema.   Neruologic: Grossly intact via light touch bilateral.   Musculoskeletal: There is currently no associated tenderness on the digits.    No images are attached to the encounter.    Results RADIOLOGY Foot X-ray: Fracture of the distal phalanges of the third and fourth toes with increased consolidation noted.   Assessment:   Toe fracture, left  Plan:  Patient was evaluated and treated and all questions answered.  Assessment and Plan Assessment & Plan Fractures of distal phalanges of right third and fourth toes with associated laceration and blister-these have since resolved. -At this time there is no open lesion or laceration noted.  Recommend Epsom salt soaks over the toenail as the nail is somewhat loose on the third digit.  Monitor any signs or symptoms of infection. - He can gradually start to transition to a  supportive shoe as tolerated. - Ice, elevation. - Should there be any increased pain or discomfort to return the surgical shoe. -Monitor for any clinical signs or symptoms of infection and directed to call the office immediately should any occur or go to the ER.  Return in about 4 weeks (around 08/08/2024) for toe fracture, x-ray; nail trim .  Donnice JONELLE Fees DPM

## 2024-08-12 ENCOUNTER — Ambulatory Visit

## 2024-08-12 ENCOUNTER — Ambulatory Visit (INDEPENDENT_AMBULATORY_CARE_PROVIDER_SITE_OTHER): Admitting: Podiatry

## 2024-08-12 VITALS — Ht 71.0 in | Wt 214.5 lb

## 2024-08-12 DIAGNOSIS — M2042 Other hammer toe(s) (acquired), left foot: Secondary | ICD-10-CM | POA: Diagnosis not present

## 2024-08-12 DIAGNOSIS — S92532D Displaced fracture of distal phalanx of left lesser toe(s), subsequent encounter for fracture with routine healing: Secondary | ICD-10-CM

## 2024-08-12 DIAGNOSIS — B351 Tinea unguium: Secondary | ICD-10-CM

## 2024-08-12 DIAGNOSIS — M79675 Pain in left toe(s): Secondary | ICD-10-CM

## 2024-08-12 DIAGNOSIS — M2041 Other hammer toe(s) (acquired), right foot: Secondary | ICD-10-CM

## 2024-08-12 DIAGNOSIS — M79674 Pain in right toe(s): Secondary | ICD-10-CM | POA: Diagnosis not present

## 2024-08-12 DIAGNOSIS — L84 Corns and callosities: Secondary | ICD-10-CM

## 2024-08-12 NOTE — Progress Notes (Unsigned)
  Subjective:  Patient ID: Carl Bowen, male    DOB: Feb 23, 1956,  MRN: [id number]  Chief Complaint  Patient presents with   Toe Pain    RM 14 Patient is here for a f/u visit for left foot fracture of the 3rd and 4th toes. Pt states burning and stinging sensation in the left 4th toe.    History of Present Illness Carl Bowen is a 68 year old male who presents with a foot injury after dropping a TV on it.  He states an infection standpoint he is doing well.  Still get some burning and tingling to his feet.  He states this was ongoing even prior to the injury though.  He has been wearing a surgical shoe intermittently.  His main concern today is actually calluses has been ongoing chronic issue to his toes mostly in the left foot worse than the right.  His nails are so thick and discolored he has difficulty trimming them.   No open lesions.  No new injuries.    Objective:   Physical Exam General: AAO x3, NAD-presents with his daughter  Dermatological: There is still some trace edema present to the third toe there is no erythema or warmth.  There is no open lesion identified at this time.  Hyperkeratotic lesion noted on the lateral aspect the left fourth toe as well as the fifth toes bilaterally.  There is no underlying ulceration, drainage or any signs of infection noted today. Nails are hypertrophic, dystrophic, brittle, discolored, elongated 10. No surrounding redness or drainage. Tenderness nails 1-5 bilaterally.   Vascular: Dorsalis Pedis artery and Posterior Tibial artery pedal pulses are 2/4 bilateral with immedate capillary fill time.  There is no pain with calf compression, swelling, warmth, erythema.   Neruologic: Grossly intact via light touch bilateral.   Musculoskeletal: There is currently no associated tenderness on the digits and the fractures.  He has good tenderness and hyperkeratotic lesions.  Digital contractures present.    No images are attached to the encounter.      Assessment:   Toe fracture, left; symptomatic onychomycosis, hyperkeratotic lesions  Plan:  Patient was evaluated and treated and all questions answered.  Assessment and Plan Assessment & Plan Fractures of distal phalanges of right third and fourth toes with associated laceration and blister-these have since resolved. - X-rays obtained and reviewed.  Multiple views obtained.  Healing fracture noted at the toes with increased consolidation noted.  No evidence of acute fracture otherwise. -At this time we discussed a conservative to gradually transition back to his regular shoe as tolerated.  Should he have any increasing pain or discomfort to return to the surgical shoe and let us  know.  Likely neuropathy -Symptoms of burning and tingling to his toes but there is ongoing prior to the injury as well.  Discussed starting medications including gabapentin but he wants to hold off on any medications.  Will continue to monitor.  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  Continue offloading.  Dispensed toe spacers.  Return in about 3 months (around 11/12/2024) for toe fracture; nail/callus trim.  Carl Bowen DPM

## 2024-11-11 ENCOUNTER — Ambulatory Visit: Admitting: Podiatry
